# Patient Record
Sex: Male | Born: 1960 | Race: Black or African American | Hispanic: No | Marital: Single | Smoking: Former smoker
Health system: Southern US, Community
[De-identification: ages and names within clinical notes are randomized; demographics above are authoritative.]

## PROBLEM LIST (undated history)

## (undated) DIAGNOSIS — R7303 Prediabetes: Secondary | ICD-10-CM

## (undated) DIAGNOSIS — E119 Type 2 diabetes mellitus without complications: Secondary | ICD-10-CM

## (undated) DIAGNOSIS — M1990 Unspecified osteoarthritis, unspecified site: Secondary | ICD-10-CM

## (undated) DIAGNOSIS — C801 Malignant (primary) neoplasm, unspecified: Secondary | ICD-10-CM

## (undated) DIAGNOSIS — E785 Hyperlipidemia, unspecified: Secondary | ICD-10-CM

## (undated) DIAGNOSIS — F32A Depression, unspecified: Secondary | ICD-10-CM

## (undated) DIAGNOSIS — I10 Essential (primary) hypertension: Secondary | ICD-10-CM

## (undated) DIAGNOSIS — J302 Other seasonal allergic rhinitis: Secondary | ICD-10-CM

## (undated) HISTORY — PX: PROSTATE BIOPSY: SHX241

---

## 2019-07-28 ENCOUNTER — Encounter (HOSPITAL_COMMUNITY): Payer: Self-pay

## 2019-07-28 ENCOUNTER — Other Ambulatory Visit: Payer: Self-pay

## 2019-07-28 ENCOUNTER — Ambulatory Visit (HOSPITAL_COMMUNITY)
Admission: EM | Admit: 2019-07-28 | Discharge: 2019-07-28 | Disposition: A | Discharge status: Home / Self Care | Payer: BLUE CROSS/BLUE SHIELD | Attending: Internal Medicine | Admitting: Internal Medicine

## 2019-07-28 DIAGNOSIS — M545 Low back pain, unspecified: Secondary | ICD-10-CM

## 2019-07-28 HISTORY — DX: Essential (primary) hypertension: I10

## 2019-07-28 HISTORY — DX: Other seasonal allergic rhinitis: J30.2

## 2019-07-28 HISTORY — DX: Hyperlipidemia, unspecified: E78.5

## 2019-07-28 LAB — COMPREHENSIVE METABOLIC PANEL
ALT: 44 U/L (ref 0–44)
AST: 35 U/L (ref 15–41)
Albumin: 4.3 g/dL (ref 3.5–5.0)
Alkaline Phosphatase: 83 U/L (ref 38–126)
Anion gap: 8 (ref 5–15)
BUN: 14 mg/dL (ref 6–20)
CO2: 26 mmol/L (ref 22–32)
Calcium: 9.3 mg/dL (ref 8.9–10.3)
Chloride: 106 mmol/L (ref 98–111)
Creatinine, Ser: 1.09 mg/dL (ref 0.61–1.24)
GFR calc Af Amer: >60 mL/min (ref >60)
GFR calc non Af Amer: >60 mL/min (ref >60)
Glucose, Bld: 116 mg/dL — ABNORMAL HIGH (ref 70–99)
Potassium: 4.2 mmol/L (ref 3.5–5.1)
Sodium: 140 mmol/L (ref 135–145)
Total Bilirubin: 0.5 mg/dL (ref 0.3–1.2)
Total Protein: 7.2 g/dL (ref 6.5–8.1)

## 2019-07-28 LAB — CBC WITH DIFFERENTIAL/PLATELET
Abs Immature Granulocytes: 0.02 10*3/uL (ref 0.00–0.07)
Basophils Absolute: 0.1 10*3/uL (ref 0.0–0.1)
Basophils Relative: 1 %
Eosinophils Absolute: 0.2 10*3/uL (ref 0.0–0.5)
Eosinophils Relative: 3 %
HCT: 48 % (ref 39.0–52.0)
Hemoglobin: 16.5 g/dL (ref 13.0–17.0)
Immature Granulocytes: 0 %
Lymphocytes Relative: 37 %
Lymphs Abs: 2.6 10*3/uL (ref 0.7–4.0)
MCH: 24.7 pg — ABNORMAL LOW (ref 26.0–34.0)
MCHC: 34.4 g/dL (ref 30.0–36.0)
MCV: 71.7 fL — ABNORMAL LOW (ref 80.0–100.0)
Monocytes Absolute: 0.7 10*3/uL (ref 0.1–1.0)
Monocytes Relative: 10 %
Neutro Abs: 3.4 10*3/uL (ref 1.7–7.7)
Neutrophils Relative %: 49 %
Platelets: 238 10*3/uL (ref 150–400)
RBC: 6.69 MIL/uL — ABNORMAL HIGH (ref 4.22–5.81)
RDW: 16.6 % — ABNORMAL HIGH (ref 11.5–15.5)
WBC: 7 10*3/uL (ref 4.0–10.5)
nRBC: 0 % (ref 0.0–0.2)

## 2019-07-28 MED ORDER — KETOROLAC TROMETHAMINE 30 MG/ML IJ SOLN
INTRAMUSCULAR | Status: AC
  Filled 2019-07-28: qty 1

## 2019-07-28 MED ORDER — CYCLOBENZAPRINE HCL 5 MG PO TABS
5.0000 mg | ORAL_TABLET | Freq: Three times a day (TID) | ORAL | 0 refills | Status: DC | PRN
Start: 2019-07-28 — End: 2020-02-21

## 2019-07-28 MED ORDER — LORATADINE 10 MG PO TABS
10.0000 mg | ORAL_TABLET | Freq: Every day | ORAL | 2 refills | Status: DC
Start: 2019-07-28 — End: 2020-04-26

## 2019-07-28 MED ORDER — LOSARTAN POTASSIUM-HCTZ 50-12.5 MG PO TABS: 1.0000 | ORAL_TABLET | Freq: Every day | ORAL | 1 refills | Status: DC

## 2019-07-28 MED ORDER — IBUPROFEN 600 MG PO TABS
600.0000 mg | ORAL_TABLET | Freq: Four times a day (QID) | ORAL | 0 refills | Status: DC | PRN
Start: 2019-07-28 — End: 2020-02-21

## 2019-07-28 MED ORDER — KETOROLAC TROMETHAMINE 30 MG/ML IJ SOLN
30.0000 mg | Freq: Once | INTRAMUSCULAR | Status: AC
  Administered 2019-07-28: 10:00:00 30 mg via INTRAMUSCULAR

## 2019-07-28 MED ORDER — AMLODIPINE BESYLATE 10 MG PO TABS: 10.0000 mg | ORAL_TABLET | Freq: Every day | ORAL | 1 refills | Status: DC

## 2019-07-28 MED ORDER — FLUTICASONE PROPIONATE 50 MCG/ACT NA SUSP: 1.0000 | Freq: Every day | NASAL | 1 refills | Status: DC

## 2019-07-28 NOTE — ED Triage Notes (Signed)
Pt c/o 8/10 throbbing pain in lower backx6 days. Pt states 6 days ago he was loading heavy boxes and that's when he got the back pain. Pt denies numbness and tingling. Pt denies loss of bowel or bladder. Pt was able to walk to exam room. Pt states when he sits straight up or gets up from sitting it aggravates it.

## 2019-07-29 NOTE — ED Provider Notes (Signed)
[place]    CSN: [id number] Arrival date & time: 07/28/19  0827      History   Chief Complaint Chief Complaint  Patient presents with  . Back Pain    HPI Julian [name] is a 59 y.o. male comes to the urgent care with throbbing lower back pain of 6 days duration.  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  He started having lower back pain afterwards.  Patient denies any numbness or tingling in the lower extremities.  He is able to walk to the exam room with no problems.  No nausea or vomiting.  No falls.  No bladder or bowel problems.  Pain is worsened by trying to sit up from laying position.  No relieving factors.  No weakness in the lower extremities.   Patient has run out of antihypertensive medications and is requesting medication refill.  Blood pressure is currently uncontrolled.  HPI  Past Medical History:  Diagnosis Date  . Hyperlipidemia   . Hypertension   . Seasonal allergies     There are no problems to display for this patient.   History reviewed. No pertinent surgical history.     Home Medications    Prior to Admission medications   Medication Sig Start Date End Date Taking? Authorizing Provider  amLODipine (NORVASC) 10 MG tablet Take 1 tablet (10 mg total) by mouth daily. 07/28/19   Raeghan Demeter, Myrene Galas, MD  cyclobenzaprine (FLEXERIL) 5 MG tablet Take 1 tablet (5 mg total) by mouth 3 (three) times daily as needed for muscle spasms. 07/28/19   Chase Picket, MD  fluticasone (FLONASE) 50 MCG/ACT nasal spray Place 1 spray into both nostrils daily. 07/28/19   Gamble Enderle, Myrene Galas, MD  ibuprofen (ADVIL) 600 MG tablet Take 1 tablet (600 mg total) by mouth every 6 (six) hours as needed. 07/28/19   Chase Picket, MD  loratadine (CLARITIN) 10 MG tablet Take 1 tablet (10 mg total) by mouth daily. 07/28/19   LampteyMyrene Galas, MD  losartan-hydrochlorothiazide (HYZAAR) 50-12.5 MG tablet Take 1 tablet by mouth daily. 07/28/19   LampteyMyrene Galas, MD     Family History Family History  Problem Relation Age of Onset  . Healthy Mother   . Healthy Father     Social History Social History   Tobacco Use  . Smoking status: Former Research scientist (life sciences)  . Smokeless tobacco: Never Used  Substance Use Topics  . Alcohol use: Never  . Drug use: Never     Allergies   Patient has no known allergies.   Review of Systems Review of Systems  Constitutional: Positive for activity change. Negative for chills, fatigue and fever.  HENT: Negative.   Respiratory: Negative for cough, chest tightness and shortness of breath.   Gastrointestinal: Negative.  Negative for abdominal pain, nausea and vomiting.  Genitourinary: Negative.   Musculoskeletal: Positive for arthralgias and back pain. Negative for joint swelling, neck pain and neck stiffness.  Neurological: Negative for dizziness, light-headedness and headaches.     Physical Exam Triage Vital Signs ED Triage Vitals [07/28/19 0903]  Enc Vitals Group     BP (!) 162/105     Pulse Rate 84     Resp 18     Temp 97.7 F (36.5 C)     Temp Source Oral     SpO2 98 %     Weight 240 lb (108.9 kg)     Height 5\' 10"  (0.865 m)     Head  Circumference      Peak Flow      Pain Score 8     Pain Loc      Pain Edu?      Excl. in [place]?    No data found.  Updated Vital Signs BP (!) 162/105   Pulse 84   Temp 97.7 F (36.5 C) (Oral)   Resp 18   Ht 5\' 10"  (1.778 m)   Wt 108.9 kg   SpO2 98%   BMI 34.44 kg/m   Visual Acuity Right Eye Distance:   Left Eye Distance:   Bilateral Distance:    Right Eye Near:   Left Eye Near:    Bilateral Near:     Physical Exam Constitutional:      General: He is in acute distress.     Appearance: He is not ill-appearing.  Cardiovascular:     Rate and Rhythm: Normal rate and regular rhythm.     Heart sounds: No murmur. No friction rub.  Pulmonary:     Effort: Pulmonary effort is normal.     Breath sounds: Normal breath sounds. No wheezing or rhonchi.   Abdominal:     General: Bowel sounds are normal. There is no distension.     Palpations: Abdomen is soft.  Musculoskeletal:        General: Tenderness present. No deformity. Normal range of motion.     Right lower leg: No edema.     Left lower leg: No edema.  Skin:    Capillary Refill: Capillary refill takes less than 2 seconds.  Neurological:     General: No focal deficit present.     Mental Status: He is alert.      UC Treatments / Results  Labs (all labs ordered are listed, but only abnormal results are displayed) Labs Reviewed  CBC WITH DIFFERENTIAL/PLATELET - Abnormal; Notable for the following components:      Result Value   RBC 6.69 (*)    MCV 71.7 (*)    MCH 24.7 (*)    RDW 16.6 (*)    All other components within normal limits  COMPREHENSIVE METABOLIC PANEL - Abnormal; Notable for the following components:   Glucose, Bld 116 (*)    All other components within normal limits    EKG   Radiology No results found.  Procedures Procedures (including critical care time)  Medications Ordered in UC Medications  ketorolac (TORADOL) 30 MG/ML injection 30 mg (30 mg Intramuscular Given 07/28/19 1008)    Initial Impression / Assessment and Plan / UC Course  I have reviewed the triage vital signs and the nursing notes.  Pertinent labs & imaging results that were available during my care of the patient were reviewed by me and considered in my medical decision making (see chart for details).     1.  Acute low back pain without sciatica: Flexeril 1 tablet 3 times daily as needed for back spasms Ibuprofen 600 mg as needed for pain Gentle range of motion exercises Return precautions given  2.  Uncontrolled hypertension: Refill amlodipine, losartan-hydrochlorothiazide. Patient is advised to be compliant with medications. Patient needs primary care physician to manage hypertension.  Final Clinical Impressions(s) / UC Diagnoses   Final diagnoses:  Acute low back  pain without sciatica, unspecified back pain laterality   Discharge Instructions   None    ED Prescriptions    Medication Sig Dispense Auth. Provider   amLODipine (NORVASC) 10 MG tablet Take 1 tablet (10 mg total) by mouth  daily. 90 tablet Anitria Andon, Britta Mccreedy, MD   fluticasone (FLONASE) 50 MCG/ACT nasal spray Place 1 spray into both nostrils daily. 16 g Lamoine Magallon, Britta Mccreedy, MD   losartan-hydrochlorothiazide (HYZAAR) 50-12.5 MG tablet Take 1 tablet by mouth daily. 90 tablet Danie Diehl, Britta Mccreedy, MD   loratadine (CLARITIN) 10 MG tablet Take 1 tablet (10 mg total) by mouth daily. 90 tablet Zaida Reiland, Britta Mccreedy, MD   ibuprofen (ADVIL) 600 MG tablet Take 1 tablet (600 mg total) by mouth every 6 (six) hours as needed. 30 tablet Laikyn Gewirtz, Britta Mccreedy, MD   cyclobenzaprine (FLEXERIL) 5 MG tablet Take 1 tablet (5 mg total) by mouth 3 (three) times daily as needed for muscle spasms. 30 tablet Luke Falero, Britta Mccreedy, MD     PDMP not reviewed this encounter.   Merrilee Jansky, MD 07/29/19 [id number]

## 2019-08-04 ENCOUNTER — Ambulatory Visit (HOSPITAL_COMMUNITY)
Admission: EM | Admit: 2019-08-04 | Discharge: 2019-08-04 | Disposition: A | Discharge status: Home / Self Care | Payer: BLUE CROSS/BLUE SHIELD | Attending: Family Medicine | Admitting: Family Medicine

## 2019-08-04 ENCOUNTER — Other Ambulatory Visit: Payer: Self-pay

## 2019-08-04 ENCOUNTER — Ambulatory Visit (INDEPENDENT_AMBULATORY_CARE_PROVIDER_SITE_OTHER): Payer: BLUE CROSS/BLUE SHIELD

## 2019-08-04 ENCOUNTER — Encounter (HOSPITAL_COMMUNITY): Payer: Self-pay

## 2019-08-04 DIAGNOSIS — M47816 Spondylosis without myelopathy or radiculopathy, lumbar region: Secondary | ICD-10-CM

## 2019-08-04 MED ORDER — PREDNISONE 10 MG (21) PO TBPK
ORAL_TABLET | ORAL | 0 refills | Status: DC
Start: 2019-08-04 — End: 2020-02-21

## 2019-08-04 MED ORDER — TRAMADOL HCL 50 MG PO TABS: 50.0000 mg | ORAL_TABLET | Freq: Two times a day (BID) | ORAL | 0 refills | Status: AC | PRN

## 2019-08-04 NOTE — ED Provider Notes (Signed)
[facility]    CSN: [id number] Arrival date & time: 08/04/19  7026      History   Chief Complaint Chief Complaint  Patient presents with  . Back Pain    HPI Julian [name] is a 59 y.o. male.   Patient is a 59 year old male that presents today with persistent lower back pain.  Symptoms have been waxing and waning.  Describes as throbbing, aching and sharp at times.  He has been taking ibuprofen and Flexeril without much relief.  Feels like the pain improves and then worsens when he goes to work.  He does a lot of strenuous, heavy lifting at work.  Also worse with movement and going from sitting to standing.  Sometimes the pain radiates into the left hip and upper leg area.  Denies any associated numbness, tingling or weakness in extremities.  Denies any saddle paresthesias, loss of bowel or bladder function.  ROS per HPI      Past Medical History:  Diagnosis Date  . Hyperlipidemia   . Hypertension   . Seasonal allergies     There are no problems to display for this patient.   History reviewed. No pertinent surgical history.     Home Medications    Prior to Admission medications   Medication Sig Start Date End Date Taking? Authorizing Provider  amLODipine (NORVASC) 10 MG tablet Take 1 tablet (10 mg total) by mouth daily. 07/28/19   Lamptey, Britta Mccreedy, MD  cyclobenzaprine (FLEXERIL) 5 MG tablet Take 1 tablet (5 mg total) by mouth 3 (three) times daily as needed for muscle spasms. 07/28/19   Merrilee Jansky, MD  fluticasone (FLONASE) 50 MCG/ACT nasal spray Place 1 spray into both nostrils daily. 07/28/19   Lamptey, Britta Mccreedy, MD  ibuprofen (ADVIL) 600 MG tablet Take 1 tablet (600 mg total) by mouth every 6 (six) hours as needed. 07/28/19   Merrilee Jansky, MD  loratadine (CLARITIN) 10 MG tablet Take 1 tablet (10 mg total) by mouth daily. 07/28/19   LampteyBritta Mccreedy, MD  losartan-hydrochlorothiazide (HYZAAR) 50-12.5 MG tablet Take 1 tablet by mouth daily. 07/28/19    Lamptey, Britta Mccreedy, MD  predniSONE (STERAPRED UNI-PAK 21 TAB) 10 MG (21) TBPK tablet 6 tabs for 1 day, then 5 tabs for 1 das, then 4 tabs for 1 day, then 3 tabs for 1 day, 2 tabs for 1 day, then 1 tab for 1 day 08/04/19   Dahlia Byes A, NP  traMADol (ULTRAM) 50 MG tablet Take 1 tablet (50 mg total) by mouth every 12 (twelve) hours as needed for up to 6 days. 08/04/19 08/10/19  Janace Aris, NP    Family History Family History  Problem Relation Age of Onset  . Healthy Mother   . Healthy Father     Social History Social History   Tobacco Use  . Smoking status: Former Games developer  . Smokeless tobacco: Never Used  Substance Use Topics  . Alcohol use: Never  . Drug use: Never     Allergies   Patient has no known allergies.   Review of Systems Review of Systems   Physical Exam Triage Vital Signs ED Triage Vitals  Enc Vitals Group     BP 08/04/19 0902 (!) 154/101     Pulse Rate 08/04/19 0902 86     Resp 08/04/19 0902 18     Temp 08/04/19 0902 98.4 F (36.9 C)     Temp Source 08/04/19 0902 Oral  SpO2 08/04/19 0902 99 %     Weight 08/04/19 0907 240 lb (108.9 kg)     Height 08/04/19 0907 5\' 10"  (1.778 m)     Head Circumference --      Peak Flow --      Pain Score 08/04/19 0907 8     Pain Loc --      Pain Edu? --      Excl. in [place]? --    No data found.  Updated Vital Signs BP (!) 154/101   Pulse 86   Temp 98.4 F (36.9 C) (Oral)   Resp 18   Ht 5\' 10"  (1.778 m)   Wt 240 lb (108.9 kg)   SpO2 99%   BMI 34.44 kg/m   Visual Acuity Right Eye Distance:   Left Eye Distance:   Bilateral Distance:    Right Eye Near:   Left Eye Near:    Bilateral Near:     Physical Exam Vitals and nursing note reviewed.  Constitutional:      Appearance: Normal appearance.  HENT:     Head: Normocephalic and atraumatic.     Nose: Nose normal.  Eyes:     Conjunctiva/sclera: Conjunctivae normal.  Pulmonary:     Effort: Pulmonary effort is normal.  Musculoskeletal:     Cervical  back: Normal range of motion.     Lumbar back: Tenderness and bony tenderness present. Decreased range of motion. Positive left straight leg raise test.       Back:  Skin:    General: Skin is warm and dry.  Neurological:     Mental Status: He is alert.  Psychiatric:        Mood and Affect: Mood normal.      UC Treatments / Results  Labs (all labs ordered are listed, but only abnormal results are displayed) Labs Reviewed - No data to display  EKG   Radiology DG Lumbar Spine Complete  Result Date: 08/04/2019 CLINICAL DATA:  Low back pain EXAM: LUMBAR SPINE - COMPLETE 4+ VIEW COMPARISON:  None. FINDINGS: Frontal, lateral, spot lumbosacral lateral, and bilateral oblique views were obtained. There are 5 non-rib-bearing lumbar type vertebral bodies. No fracture or spondylolisthesis. Disc spaces appear unremarkable. There are multiple anterior and lateral osteophytes. There is facet osteoarthritic change at L4-5 and L5-S1 bilaterally. Other facets appear unremarkable. IMPRESSION: Facet osteoarthritic change at L4-5 and L5-S1. Osteophytes at multiple levels noted. No appreciable disc space narrowing. No fracture or spondylolisthesis. Electronically Signed   By: Lowella Grip III M.D.   On: 08/04/2019 10:36    Procedures Procedures (including critical care time)  Medications Ordered in UC Medications - No data to display  Initial Impression / Assessment and Plan / UC Course  I have reviewed the triage vital signs and the nursing notes.  Pertinent labs & imaging results that were available during my care of the patient were reviewed by me and considered in my medical decision making (see chart for details).    Osteoarthritis of the spine Osteoarthritic changes of the lumbar spine.  Located L4-L5 and L5-S1.  Osteophytes at multiple levels There is also some sciatic nerve involvement.  Positive straight leg on the left. Treating with prednisone taper over the next 6 days.  Tramadol  as needed for severe pain He can continue the Flexeril as needed for muscle relaxants. Contact given for primary care for follow-up in case symptoms do not improve Follow up as needed for continued or worsening symptoms  Final Clinical  Impressions(s) / UC Diagnoses   Final diagnoses:  Osteoarthritis of lumbar spine, unspecified spinal osteoarthritis complication status     Discharge Instructions     I believe that your symptoms are related to some osteoarthritis of the spine and sciatic nerve pain. We are treating this with prednisone taper over the next 6 days.  Take this with food. Tramadol as needed for severe pain.  Be aware this may make you drowsy. Rest, work not given  Contact given for primary care follow-up for any continued or worsening problems.     ED Prescriptions    Medication Sig Dispense Auth. Provider   predniSONE (STERAPRED UNI-PAK 21 TAB) 10 MG (21) TBPK tablet 6 tabs for 1 day, then 5 tabs for 1 das, then 4 tabs for 1 day, then 3 tabs for 1 day, 2 tabs for 1 day, then 1 tab for 1 day 21 tablet Adriano Bischof A, NP   traMADol (ULTRAM) 50 MG tablet Take 1 tablet (50 mg total) by mouth every 12 (twelve) hours as needed for up to 6 days. 12 tablet Tehani Mersman A, NP     I have reviewed the PDMP during this encounter.   Janace Aris, NP 08/04/19 1315

## 2019-08-04 NOTE — Discharge Instructions (Addendum)
I believe that your symptoms are related to some osteoarthritis of the spine and sciatic nerve pain. We are treating this with prednisone taper over the next 6 days.  Take this with food. Tramadol as needed for severe pain.  Be aware this may make you drowsy. Rest, work not given  Contact given for primary care follow-up for any continued or worsening problems.

## 2019-08-04 NOTE — ED Triage Notes (Signed)
Pt c/o 8/10 sharp throbbing pain in lower back. Pt states the back pain is worse with movement and going from sitting to standing. Pt states the meds help when they're in his system, but soon as it leaves his system he's in pain again. Pt states he was bent over pushing boxes from one side to another at work and it med his back hurt worse. Pt denies numbness and tingling. Pt was able to walk to exam room.

## 2020-02-06 ENCOUNTER — Other Ambulatory Visit (HOSPITAL_COMMUNITY): Payer: Self-pay | Admitting: Physician Assistant

## 2020-02-06 ENCOUNTER — Other Ambulatory Visit: Payer: Self-pay | Admitting: Physician Assistant

## 2020-02-07 ENCOUNTER — Other Ambulatory Visit (HOSPITAL_COMMUNITY): Payer: Self-pay | Admitting: Physician Assistant

## 2020-02-07 DIAGNOSIS — M5416 Radiculopathy, lumbar region: Secondary | ICD-10-CM

## 2020-02-24 NOTE — Progress Notes (Signed)
PCP:  Denies Cardiologist:  Denies   EKG:  DOS CXR:  N/A ECHO:  Denies Stress Test:  Denies Cardiac Cath:  Denies  Fasting Blood Sugar-  N/A Checks Blood Sugar__N/A_ times a day  OSA/CPAP:  No  ASA/Blood Thinner:  No  Covid test 02/25/20  Anesthesia Review:  No  Patient denies shortness of breath, fever, cough, and chest pain at PAT appointment.  Patient verbalized understanding of instructions provided today at the PAT appointment.  Patient asked to review instructions at home and day of surgery.

## 2020-02-25 ENCOUNTER — Other Ambulatory Visit (HOSPITAL_COMMUNITY)
Admission: RE | Admit: 2020-02-25 | Discharge: 2020-02-25 | Disposition: A | Discharge status: Home / Self Care | Payer: BLUE CROSS/BLUE SHIELD | Source: Ambulatory Visit | Attending: Physician Assistant | Admitting: Physician Assistant

## 2020-02-25 DIAGNOSIS — Z20822 Contact with and (suspected) exposure to covid-19: Secondary | ICD-10-CM | POA: Insufficient documentation

## 2020-02-25 DIAGNOSIS — Z01812 Encounter for preprocedural laboratory examination: Secondary | ICD-10-CM | POA: Diagnosis not present

## 2020-02-26 LAB — SARS CORONAVIRUS 2 (TAT 6-24 HRS): SARS Coronavirus 2: NEGATIVE

## 2020-02-28 ENCOUNTER — Other Ambulatory Visit: Payer: Self-pay

## 2020-02-28 ENCOUNTER — Encounter (HOSPITAL_COMMUNITY): Admission: RE | Disposition: A | Discharge status: Home / Self Care | Payer: Self-pay | Source: Ambulatory Visit

## 2020-02-28 ENCOUNTER — Ambulatory Visit (HOSPITAL_COMMUNITY)
Admission: RE | Admit: 2020-02-28 | Discharge: 2020-02-28 | Disposition: A | Discharge status: Home / Self Care | Payer: BLUE CROSS/BLUE SHIELD | Source: Ambulatory Visit | Attending: Physician Assistant | Admitting: Physician Assistant

## 2020-02-28 ENCOUNTER — Encounter (HOSPITAL_COMMUNITY): Payer: Self-pay

## 2020-02-28 ENCOUNTER — Ambulatory Visit (HOSPITAL_COMMUNITY): Payer: BLUE CROSS/BLUE SHIELD | Admitting: Certified Registered Nurse Anesthetist

## 2020-02-28 DIAGNOSIS — M4726 Other spondylosis with radiculopathy, lumbar region: Secondary | ICD-10-CM | POA: Insufficient documentation

## 2020-02-28 DIAGNOSIS — M48061 Spinal stenosis, lumbar region without neurogenic claudication: Secondary | ICD-10-CM | POA: Insufficient documentation

## 2020-02-28 DIAGNOSIS — Z87891 Personal history of nicotine dependence: Secondary | ICD-10-CM | POA: Insufficient documentation

## 2020-02-28 DIAGNOSIS — M5416 Radiculopathy, lumbar region: Secondary | ICD-10-CM | POA: Insufficient documentation

## 2020-02-28 HISTORY — PX: RADIOLOGY WITH ANESTHESIA: SHX6223

## 2020-02-28 LAB — BASIC METABOLIC PANEL
Anion gap: 13 (ref 5–15)
BUN: 16 mg/dL (ref 6–20)
CO2: 23 mmol/L (ref 22–32)
Calcium: 9.3 mg/dL (ref 8.9–10.3)
Chloride: 107 mmol/L (ref 98–111)
Creatinine, Ser: 1.33 mg/dL — ABNORMAL HIGH (ref 0.61–1.24)
GFR, Estimated: >60 mL/min (ref >60)
Glucose, Bld: 150 mg/dL — ABNORMAL HIGH (ref 70–99)
Potassium: 4.4 mmol/L (ref 3.5–5.1)
Sodium: 143 mmol/L (ref 135–145)

## 2020-02-28 LAB — CBC
HCT: 48.5 % (ref 39.0–52.0)
Hemoglobin: 16.4 g/dL (ref 13.0–17.0)
MCH: 24.4 pg — ABNORMAL LOW (ref 26.0–34.0)
MCHC: 33.8 g/dL (ref 30.0–36.0)
MCV: 72.2 fL — ABNORMAL LOW (ref 80.0–100.0)
Platelets: 224 10*3/uL (ref 150–400)
RBC: 6.72 MIL/uL — ABNORMAL HIGH (ref 4.22–5.81)
RDW: 17.2 % — ABNORMAL HIGH (ref 11.5–15.5)
WBC: 10.3 10*3/uL (ref 4.0–10.5)
nRBC: 0 % (ref 0.0–0.2)

## 2020-02-28 SURGERY — MRI WITH ANESTHESIA
Anesthesia: General

## 2020-02-28 MED ORDER — PROPOFOL 10 MG/ML IV BOLUS
INTRAVENOUS | Status: DC | PRN
  Administered 2020-02-28: 200 mg via INTRAVENOUS
  Administered 2020-02-28: 50 mg via INTRAVENOUS
  Administered 2020-02-28: 100 mg via INTRAVENOUS

## 2020-02-28 MED ORDER — ONDANSETRON HCL 4 MG/2ML IJ SOLN
INTRAMUSCULAR | Status: DC | PRN
  Administered 2020-02-28: 4 mg via INTRAVENOUS

## 2020-02-28 MED ORDER — MIDAZOLAM HCL 2 MG/2ML IJ SOLN
INTRAMUSCULAR | Status: DC | PRN
  Administered 2020-02-28: 2 mg via INTRAVENOUS

## 2020-02-28 MED ORDER — LACTATED RINGERS IV SOLN: INTRAVENOUS | Status: DC

## 2020-02-28 MED ORDER — LIDOCAINE 2% (20 MG/ML) 5 ML SYRINGE
INTRAMUSCULAR | Status: DC | PRN
  Administered 2020-02-28: 100 mg via INTRAVENOUS

## 2020-02-28 NOTE — Anesthesia Procedure Notes (Addendum)
Procedure Name: LMA Insertion Date/Time: 02/28/2020 8:38 AM Performed by: Drema Pry, CRNA Pre-anesthesia Checklist: Patient identified, Emergency Drugs available, Suction available and Patient being monitored Patient Re-evaluated:Patient Re-evaluated prior to induction Oxygen Delivery Method: Circle system utilized Preoxygenation: Pre-oxygenation with 100% oxygen Induction Type: IV induction LMA: LMA inserted LMA Size: 5.0 Number of attempts: 1 Placement Confirmation: positive ETCO2 and breath sounds checked- equal and bilateral Tube secured with: Tape Dental Injury: Teeth and Oropharynx as per pre-operative assessment

## 2020-02-28 NOTE — Anesthesia Preprocedure Evaluation (Addendum)
Anesthesia Evaluation  Patient identified by MRN, date of birth, ID band Patient awake    Reviewed: Allergy & Precautions, NPO status , Patient's Chart, lab work & pertinent test results  Airway Mallampati: II  TM Distance: >3 FB Neck ROM: Full    Dental  (+) Missing, Dental Advisory Given, Loose,    Pulmonary neg pulmonary ROS, former smoker,    Pulmonary exam normal breath sounds clear to auscultation       Cardiovascular hypertension, Pt. on medications Normal cardiovascular exam Rhythm:Regular Rate:Normal  HLD   Neuro/Psych negative neurological ROS  negative psych ROS   GI/Hepatic negative GI ROS, Neg liver ROS,   Endo/Other  negative endocrine ROS  Renal/GU negative Renal ROS  negative genitourinary   Musculoskeletal negative musculoskeletal ROS (+)   Abdominal   Peds  Hematology negative hematology ROS (+)   Anesthesia Other Findings   Reproductive/Obstetrics                            Anesthesia Physical Anesthesia Plan  ASA: II  Anesthesia Plan: General   Post-op Pain Management:    Induction: Intravenous  PONV Risk Score and Plan: 2 and Midazolam, Dexamethasone and Ondansetron  Airway Management Planned: LMA  Additional Equipment:   Intra-op Plan:   Post-operative Plan: Extubation in OR  Informed Consent: I have reviewed the patients History and Physical, chart, labs and discussed the procedure including the risks, benefits and alternatives for the proposed anesthesia with the patient or authorized representative who has indicated his/her understanding and acceptance.     Dental advisory given  Plan Discussed with: CRNA  Anesthesia Plan Comments:         Anesthesia Quick Evaluation

## 2020-02-28 NOTE — Transfer of Care (Signed)
Immediate Anesthesia Transfer of Care Note  Patient: Julian [name]  Procedure(s) Performed: MRI WITH ANESTHESIA LUMBAR SPINE WITHOUT CONTRAST (N/A )  Patient Location: PACU  Anesthesia Type:General  Level of Consciousness: drowsy and patient cooperative  Airway & Oxygen Therapy: Patient Spontanous Breathing  Post-op Assessment: Report given to RN and Post -op Vital signs reviewed and stable  Post vital signs: Reviewed and stable  Last Vitals:  Vitals Value Taken Time  BP 144/99 02/28/20 1023  Temp 36.4 C 02/28/20 1023  Pulse 96 02/28/20 1025  Resp 19 02/28/20 1025  SpO2 95 % 02/28/20 1025  Vitals shown include unvalidated device data.  Last Pain:  Vitals:   02/28/20 0843  TempSrc:   PainSc: 0-No pain         Complications: No complications documented.

## 2020-02-28 NOTE — Progress Notes (Signed)
Patient is in phase 2 and cleared for discharge. He is waiting on disc to be made from his MRI.

## 2020-02-29 ENCOUNTER — Encounter (HOSPITAL_COMMUNITY): Payer: Self-pay | Admitting: Radiology

## 2020-02-29 NOTE — Anesthesia Postprocedure Evaluation (Signed)
Anesthesia Post Note  Patient: Julian [name]  Procedure(s) Performed: MRI WITH ANESTHESIA LUMBAR SPINE WITHOUT CONTRAST (N/A )     Patient location during evaluation: PACU Anesthesia Type: General Level of consciousness: awake and alert Pain management: pain level controlled Vital Signs Assessment: post-procedure vital signs reviewed and stable Respiratory status: spontaneous breathing, nonlabored ventilation, respiratory function stable and patient connected to nasal cannula oxygen Cardiovascular status: blood pressure returned to baseline and stable Postop Assessment: no apparent nausea or vomiting Anesthetic complications: no   No complications documented.  Last Vitals:  Vitals:   02/28/20 1039 02/28/20 1057  BP: (!) 145/99   Pulse: 90   Resp: 20   Temp:  (!) 36.2 C  SpO2: 95%     Last Pain:  Vitals:   02/28/20 1039  TempSrc:   PainSc: 0-No pain                 Kyel Purk L Angenette Daily

## 2020-04-26 ENCOUNTER — Ambulatory Visit (HOSPITAL_COMMUNITY)
Admission: EM | Admit: 2020-04-26 | Discharge: 2020-04-26 | Disposition: A | Discharge status: Home / Self Care | Payer: HRSA Program | Attending: Student | Admitting: Student

## 2020-04-26 ENCOUNTER — Encounter (HOSPITAL_COMMUNITY): Payer: Self-pay

## 2020-04-26 ENCOUNTER — Other Ambulatory Visit: Payer: Self-pay

## 2020-04-26 ENCOUNTER — Ambulatory Visit (INDEPENDENT_AMBULATORY_CARE_PROVIDER_SITE_OTHER): Payer: Self-pay

## 2020-04-26 DIAGNOSIS — I10 Essential (primary) hypertension: Secondary | ICD-10-CM | POA: Diagnosis not present

## 2020-04-26 DIAGNOSIS — U071 COVID-19: Secondary | ICD-10-CM

## 2020-04-26 DIAGNOSIS — J302 Other seasonal allergic rhinitis: Secondary | ICD-10-CM

## 2020-04-26 DIAGNOSIS — R059 Cough, unspecified: Secondary | ICD-10-CM

## 2020-04-26 DIAGNOSIS — J1282 Pneumonia due to coronavirus disease 2019: Secondary | ICD-10-CM

## 2020-04-26 DIAGNOSIS — R0602 Shortness of breath: Secondary | ICD-10-CM

## 2020-04-26 MED ORDER — ALBUTEROL SULFATE HFA 108 (90 BASE) MCG/ACT IN AERS
INHALATION_SPRAY | RESPIRATORY_TRACT | Status: AC
  Filled 2020-04-26: qty 6.7

## 2020-04-26 MED ORDER — AZITHROMYCIN 250 MG PO TABS
250.0000 mg | ORAL_TABLET | Freq: Every day | ORAL | 0 refills | Status: DC
Start: 2020-04-26 — End: 2023-05-22

## 2020-04-26 MED ORDER — FLUTICASONE PROPIONATE 50 MCG/ACT NA SUSP
1.0000 | Freq: Every day | NASAL | 2 refills | Status: DC
Start: 2020-04-26 — End: 2023-05-22

## 2020-04-26 MED ORDER — AMLODIPINE BESYLATE 10 MG PO TABS: 10.0000 mg | ORAL_TABLET | Freq: Every day | ORAL | 2 refills | Status: AC

## 2020-04-26 MED ORDER — BENZONATATE 100 MG PO CAPS
100.0000 mg | ORAL_CAPSULE | Freq: Three times a day (TID) | ORAL | 0 refills | Status: DC
Start: 2020-04-26 — End: 2023-05-22

## 2020-04-26 MED ORDER — PULSE OXIMETER FOR FINGER MISC: 1.0000 | Freq: Two times a day (BID) | 0 refills | Status: DC

## 2020-04-26 MED ORDER — LOSARTAN POTASSIUM-HCTZ 50-12.5 MG PO TABS
1.0000 | ORAL_TABLET | Freq: Every day | ORAL | 2 refills | Status: DC
Start: 2020-04-26 — End: 2023-05-22

## 2020-04-26 MED ORDER — LORATADINE 10 MG PO TABS
10.0000 mg | ORAL_TABLET | Freq: Every day | ORAL | 2 refills | Status: AC
Start: 2020-04-26 — End: ?

## 2020-04-26 MED ORDER — PULSE OXIMETER MISC: 1.0000 | Freq: Two times a day (BID) | 0 refills | Status: AC

## 2020-04-26 MED ORDER — ALBUTEROL SULFATE HFA 108 (90 BASE) MCG/ACT IN AERS
1.0000 | INHALATION_SPRAY | Freq: Four times a day (QID) | RESPIRATORY_TRACT | Status: DC | PRN
  Administered 2020-04-26: 2 via RESPIRATORY_TRACT

## 2020-04-26 NOTE — ED Provider Notes (Signed)
[facility]    CSN: [id number] Arrival date & time: 04/26/20  1320      History   Chief Complaint Chief Complaint  Patient presents with  . Shortness of Breath  . Fatigue  . Cough  . loss of appetite    HPI Julian [name] is a 60 y.o. male presenting with shortness of breath fatigue cough and loss of appetite.  History of hypertension seasonal allergies and hyperlipidemia.  Patient states he tested positive for Covid 2 weeks ago.  Since then he has continued to cough and have shortness of breath particularly with exertion.  He endorses dry cough fatigue loss of appetite for 2 weeks.  Endorses few episodes of diarrhea yesterday.  Endorses subjective chills. Endorses nausea but no vomiting.  He is not vaccinated for Covid.  Denies chest pain, leg swelling  He also endorses a history of seasonal allergies that are typically well controlled on his claritin and Flonase.  He request refill of this he also requires refill of blood pressure medications.  States that his blood pressure is typically well controlled on these but he has been out of them due to not having insurance right now  HPI  Past Medical History:  Diagnosis Date  . Hyperlipidemia   . Hypertension   . Seasonal allergies     There are no problems to display for this patient.   Past Surgical History:  Procedure Laterality Date  . RADIOLOGY WITH ANESTHESIA N/A 02/28/2020   Procedure: MRI WITH ANESTHESIA LUMBAR SPINE WITHOUT CONTRAST;  Surgeon: Radiologist, Medication, MD;  Location: [facility];  Service: Radiology;  Laterality: N/A;       Home Medications    Prior to Admission medications   Medication Sig Start Date End Date Taking? Authorizing Provider  amLODipine (NORVASC) 10 MG tablet Take 1 tablet (10 mg total) by mouth daily. 04/26/20  Yes Rhys Martini, [name]  fluticasone (FLONASE) 50 MCG/ACT nasal spray Place 1 spray into both nostrils daily. 04/26/20  Yes Rhys Martini, [name]  loratadine (CLARITIN) 10  MG tablet Take 1 tablet (10 mg total) by mouth daily. 04/26/20  Yes Rhys Martini, [name]  losartan-hydrochlorothiazide (HYZAAR) 50-12.5 MG tablet Take 1 tablet by mouth daily. 04/26/20  Yes Rhys Martini, [name]  acetaminophen (TYLENOL) 500 MG tablet Take 500-1,000 mg by mouth every 6 (six) hours as needed (for pain.).    [provider]  cetirizine (ZYRTEC) 10 MG tablet Take 10 mg by mouth daily.    [provider]  gabapentin (NEURONTIN) 300 MG capsule Take 300 mg by mouth at bedtime.    [provider]  nabumetone (RELAFEN) 750 MG tablet Take 750 mg by mouth in the morning and at bedtime.    [provider]  tiZANidine (ZANAFLEX) 4 MG tablet Take 4 mg by mouth 3 (three) times daily as needed for muscle spasms.    [provider]    Family History Family History  Problem Relation Age of Onset  . Healthy Mother   . Healthy Father     Social History Social History   Tobacco Use  . Smoking status: Former Games developer  . Smokeless tobacco: Never Used  Vaping Use  . Vaping Use: Never used  Substance Use Topics  . Alcohol use: Never  . Drug use: Never     Allergies   Patient has no known allergies.   Review of Systems Review of Systems  Constitutional: Negative for appetite change, chills and fever.  HENT: Positive for congestion. Negative for ear pain, rhinorrhea, sinus pressure, sinus pain and sore throat.   Eyes: Negative for redness and visual disturbance.  Respiratory: Positive for cough and shortness of breath. Negative for chest tightness and wheezing.   Cardiovascular: Negative for chest pain and palpitations.  Gastrointestinal: Positive for diarrhea. Negative for abdominal pain, constipation, nausea and vomiting.  Genitourinary: Negative for dysuria, frequency and urgency.  Musculoskeletal: Negative for myalgias.  Neurological: Negative for dizziness, weakness and headaches.  Psychiatric/Behavioral: Negative for confusion.  All  other systems reviewed and are negative.    Physical Exam Triage Vital Signs ED Triage Vitals  Enc Vitals Group     BP 04/26/20 1409 (!) 156/98     Pulse Rate 04/26/20 1409 (!) 103     Resp 04/26/20 1409 20     Temp 04/26/20 1409 99.3 F (37.4 C)     Temp src --      SpO2 04/26/20 1409 95 %     Weight --      Height --      Head Circumference --      Peak Flow --      Pain Score 04/26/20 1407 5     Pain Loc --      Pain Edu? --      Excl. in GC? --    No data found.  Updated Vital Signs BP (!) 156/98   Pulse (!) 103   Temp 99.3 F (37.4 C)   Resp 20   SpO2 95%   Visual Acuity Right Eye Distance:   Left Eye Distance:   Bilateral Distance:    Right Eye Near:   Left Eye Near:    Bilateral Near:     Physical Exam Vitals reviewed.  Constitutional:      General: He is not in acute distress.    Appearance: Normal appearance. He is not ill-appearing.  HENT:     Head: Normocephalic and atraumatic.     Right Ear: Hearing, tympanic membrane, ear canal and external ear normal. No swelling or tenderness. There is no impacted cerumen. No mastoid tenderness. Tympanic membrane is not perforated, erythematous, retracted or bulging.     Left Ear: Hearing, tympanic membrane, ear canal and external ear normal. No swelling or tenderness. There is no impacted cerumen. No mastoid tenderness. Tympanic membrane is not perforated, erythematous, retracted or bulging.     Nose:     Right Sinus: No maxillary sinus tenderness or frontal sinus tenderness.     Left Sinus: No maxillary sinus tenderness or frontal sinus tenderness.     Mouth/Throat:     Mouth: Mucous membranes are moist.     Pharynx: Uvula midline. No oropharyngeal exudate or posterior oropharyngeal erythema.     Tonsils: No tonsillar exudate.  Cardiovascular:     Rate and Rhythm: Normal rate and regular rhythm.     Heart sounds: Normal heart sounds.  Pulmonary:     Breath sounds: Normal air entry. Examination of the  right-upper field reveals decreased breath sounds. Examination of the left-upper field reveals decreased breath sounds. Examination of the right-middle field reveals decreased breath sounds. Examination of the left-middle field reveals decreased breath sounds. Decreased breath sounds present. No wheezing, rhonchi or rales.  Chest:     Chest wall: No tenderness.  Abdominal:     General: Abdomen is flat. Bowel sounds are normal.     Tenderness: There is no abdominal tenderness. There is no guarding or rebound.  Lymphadenopathy:  Cervical: No cervical adenopathy.  Neurological:     General: No focal deficit present.     Mental Status: He is alert and oriented to person, place, and time.  Psychiatric:        Attention and Perception: Attention and perception normal.        Mood and Affect: Mood and affect normal.        Behavior: Behavior normal. Behavior is cooperative.        Thought Content: Thought content normal.        Judgment: Judgment normal.      UC Treatments / Results  Labs (all labs ordered are listed, but only abnormal results are displayed) Labs Reviewed - No data to display  EKG   Radiology No results found.  Procedures Procedures (including critical care time)  Medications Ordered in UC Medications - No data to display  Initial Impression / Assessment and Plan / UC Course  I have reviewed the triage vital signs and the nursing notes.  Pertinent labs & imaging results that were available during my care of the patient were reviewed by me and considered in my medical decision making (see chart for details).    This patient was diagnosed with Covid 2 weeks ago, presenting today with continued shortness of breath and cough. Not vaccinated for covid.   Chest x-ray today showing mild patchy bilateral lung opacities, left greater than right, concerning for multifocal pneumonia. Plan to treat for pneumonia with z-pack, albuterol inhaler, tessalon as below. Also sent  script for pulse ox machiene and discussed strict return precautions with this patient.   Blood pressure medications refilled as below. He states he has taken these for years without issue. He will f/u with his PCP as soon as he has  [employer] again.  claritin and flonase also refilled as below.   Spent over 40 minutes obtaining H&P, performing physical, interpreting films, discussing results, treatment plan and plan for follow-up with patient. Patient agrees with plan.     Final Clinical Impressions(s) / UC Diagnoses   Final diagnoses:  COVID-19  Essential hypertension  Seasonal allergies   Discharge Instructions   None    ED Prescriptions    Medication Sig Dispense Auth. Provider   loratadine (CLARITIN) 10 MG tablet Take 1 tablet (10 mg total) by mouth daily. 30 tablet Rhys Martini, [name]   fluticasone [facility]) 50 MCG/ACT nasal spray Place 1 spray into both nostrils daily. 15 mL Rhys Martini, [name]   losartan-hydrochlorothiazide (HYZAAR) 50-12.5 MG tablet Take 1 tablet by mouth daily. 30 tablet Rhys Martini, [name]   amLODipine (NORVASC) 10 MG tablet Take 1 tablet (10 mg total) by mouth daily. 30 tablet Rhys Martini, [name]     PDMP not reviewed this encounter.   Rhys Martini, [name] 04/26/20 1623

## 2020-04-26 NOTE — Discharge Instructions (Addendum)
-  Take the antibiotic- z-pack. Take two pills today. On days 2-4, one pill daily.  -Use the albuterol inhaler as needed for shortness of breath and cough.  -You can also try the Tessalon (Benzonatate), up to 3x daily for cough.  -Restart your blood pressure and allergy medications.  -Please pick up  pulse-ox monitor. Monitor your oxygen several times daily. If it drops below 94%, or you are feeling short of breath, seek additional medical attention.  -Come back and see [place] if you develop chest pain, worsening shortness of breath despite treatment, new/worsening fevers/chills, etc.

## 2020-04-26 NOTE — ED Triage Notes (Addendum)
Pt in with c/o SOB, dry cough, fatigue and loss of appetite that has been going on for 2 weeks now.  Pt has been taking tylenol for sxs with minimal relief  Pt tested positive for covid 2 weeks ago  Pt also requesting refill on his BP meds

## 2020-04-29 ENCOUNTER — Emergency Department (HOSPITAL_COMMUNITY): Payer: HRSA Program

## 2020-04-29 ENCOUNTER — Emergency Department (HOSPITAL_COMMUNITY)
Admission: EM | Admit: 2020-04-29 | Discharge: 2020-04-29 | Disposition: A | Discharge status: Home / Self Care | Payer: HRSA Program | Attending: Emergency Medicine | Admitting: Emergency Medicine

## 2020-04-29 ENCOUNTER — Encounter (HOSPITAL_COMMUNITY): Payer: Self-pay | Admitting: Emergency Medicine

## 2020-04-29 ENCOUNTER — Other Ambulatory Visit: Payer: Self-pay

## 2020-04-29 DIAGNOSIS — I10 Essential (primary) hypertension: Secondary | ICD-10-CM | POA: Insufficient documentation

## 2020-04-29 DIAGNOSIS — Z79899 Other long term (current) drug therapy: Secondary | ICD-10-CM | POA: Diagnosis not present

## 2020-04-29 DIAGNOSIS — Z87891 Personal history of nicotine dependence: Secondary | ICD-10-CM | POA: Insufficient documentation

## 2020-04-29 DIAGNOSIS — R0602 Shortness of breath: Secondary | ICD-10-CM | POA: Diagnosis present

## 2020-04-29 DIAGNOSIS — U071 COVID-19: Secondary | ICD-10-CM

## 2020-04-29 LAB — BASIC METABOLIC PANEL
Anion gap: 14 (ref 5–15)
BUN: 24 mg/dL — ABNORMAL HIGH (ref 6–20)
CO2: 20 mmol/L — ABNORMAL LOW (ref 22–32)
Calcium: 9 mg/dL (ref 8.9–10.3)
Chloride: 105 mmol/L (ref 98–111)
Creatinine, Ser: 1.6 mg/dL — ABNORMAL HIGH (ref 0.61–1.24)
GFR, Estimated: 50 mL/min — ABNORMAL LOW (ref >60)
Glucose, Bld: 129 mg/dL — ABNORMAL HIGH (ref 70–99)
Potassium: 3.9 mmol/L (ref 3.5–5.1)
Sodium: 139 mmol/L (ref 135–145)

## 2020-04-29 LAB — TROPONIN I (HIGH SENSITIVITY)
Troponin I (High Sensitivity): 21 ng/L — ABNORMAL HIGH (ref <18)
Troponin I (High Sensitivity): 22 ng/L — ABNORMAL HIGH (ref <18)

## 2020-04-29 LAB — CBC
HCT: 47.5 % (ref 39.0–52.0)
Hemoglobin: 15.9 g/dL (ref 13.0–17.0)
MCH: 23.8 pg — ABNORMAL LOW (ref 26.0–34.0)
MCHC: 33.5 g/dL (ref 30.0–36.0)
MCV: 71.1 fL — ABNORMAL LOW (ref 80.0–100.0)
Platelets: 380 10*3/uL (ref 150–400)
RBC: 6.68 MIL/uL — ABNORMAL HIGH (ref 4.22–5.81)
RDW: 17.2 % — ABNORMAL HIGH (ref 11.5–15.5)
WBC: 7.3 10*3/uL (ref 4.0–10.5)
nRBC: 0 % (ref 0.0–0.2)

## 2020-04-29 LAB — D-DIMER, QUANTITATIVE: D-Dimer, Quant: 0.56 ug/mL-FEU — ABNORMAL HIGH (ref 0.00–0.50)

## 2020-04-29 MED ORDER — SODIUM CHLORIDE 0.9 % IV BOLUS
1000.0000 mL | Freq: Once | INTRAVENOUS | Status: AC
  Administered 2020-04-29: 1000 mL via INTRAVENOUS

## 2020-04-29 NOTE — ED Provider Notes (Signed)
[facility] EMERGENCY DEPARTMENT Provider Note   CSN: [id number] Arrival date & time: 04/29/20  9147     History Chief Complaint  Patient presents with  . Covid Positive  . Shortness of Breath    Julian [name] is a 60 y.o. male.  Presents to ER with concern for shortness of breath.  Overall symptoms started approximately 2 weeks ago, initially mild symptoms with cough, malaise and body aches.  Over the past week he has had worsening shortness of breath.  No chest pain.  Has had poor appetite and some nausea.  Also having occasional loose stool.  No nausea currently. Has not been vaccinated for COVID-19.  Additional history from chart review Patient went to urgent care on 2/3.  At that time was Covid positive, CXR with pneumonia.  Given Z-Pak.  Patient reports he has taken this. HPI     Past Medical History:  Diagnosis Date  . Hyperlipidemia   . Hypertension   . Seasonal allergies     There are no problems to display for this patient.   Past Surgical History:  Procedure Laterality Date  . RADIOLOGY WITH ANESTHESIA N/A 02/28/2020   Procedure: MRI WITH ANESTHESIA LUMBAR SPINE WITHOUT CONTRAST;  Surgeon: Radiologist, Medication, MD;  Location: [facility];  Service: Radiology;  Laterality: N/A;       Family History  Problem Relation Age of Onset  . Healthy Mother   . Healthy Father     Social History   Tobacco Use  . Smoking status: Former Games developer  . Smokeless tobacco: Never Used  Vaping Use  . Vaping Use: Never used  Substance Use Topics  . Alcohol use: Never  . Drug use: Never    Home Medications Prior to Admission medications   Medication Sig Start Date End Date Taking? Authorizing Provider  acetaminophen (TYLENOL) 500 MG tablet Take 500-1,000 mg by mouth every 6 (six) hours as needed (for pain.).   Yes [provider]  amLODipine (NORVASC) 10 MG tablet Take 1 tablet (10 mg total) by mouth daily. 04/26/20  Yes Rhys Martini, [name]   azithromycin (ZITHROMAX Z-PAK) 250 MG tablet Take 1 tablet (250 mg total) by mouth daily. Z-pack: take two pills (500mg ) on day 1. Take 1 pill (250mg ) on days 2-4. 04/26/20  Yes , [name]  benzonatate (TESSALON) 100 MG capsule Take 1 capsule (100 mg total) by mouth every 8 (eight) hours. 04/26/20  Yes Rhys Martini, [name]  cetirizine (ZYRTEC) 10 MG tablet Take 10 mg by mouth daily.   Yes [provider]  fluticasone (FLONASE) 50 MCG/ACT nasal spray Place 1 spray into both nostrils daily. 04/26/20  Yes Rhys Martini, [name]  gabapentin (NEURONTIN) 300 MG capsule Take 300 mg by mouth at bedtime.   Yes [provider]  loratadine (CLARITIN) 10 MG tablet Take 1 tablet (10 mg total) by mouth daily. 04/26/20  Yes Rhys Martini, [name]  losartan-hydrochlorothiazide (HYZAAR) 50-12.5 MG tablet Take 1 tablet by mouth daily. 04/26/20  Yes Rhys Martini, [name]  nabumetone (RELAFEN) 750 MG tablet Take 750 mg by mouth in the morning and at bedtime.   Yes [provider]  Misc. Devices (PULSE OXIMETER FOR FINGER) MISC 1 each by Does not apply route in the morning and at bedtime. 04/26/20   Rhys Martini, [name]  Misc. Devices (PULSE OXIMETER) MISC 1 each by Does not apply route in the morning and at bedtime. 04/26/20   Rhys Martini,  [name]  tiZANidine (ZANAFLEX) 4 MG tablet Take 4 mg by mouth 3 (three) times daily as needed for muscle spasms.    [provider]    Allergies    Patient has no known allergies.  Review of Systems   Review of Systems  Constitutional: Positive for chills and fatigue. Negative for fever.  HENT: Negative for ear pain and sore throat.   Eyes: Negative for pain and visual disturbance.  Respiratory: Positive for cough and shortness of breath.   Cardiovascular: Negative for chest pain and palpitations.  Gastrointestinal: Negative for abdominal pain and vomiting.  Genitourinary: Negative for dysuria and hematuria.  Musculoskeletal: Negative for  arthralgias and back pain.  Skin: Negative for color change and rash.  Neurological: Negative for seizures and syncope.  All other systems reviewed and are negative.   Physical Exam Updated Vital Signs BP 111/80   Pulse (!) 102   Temp 98.5 F (36.9 C) (Oral)   Resp (!) 26   SpO2 96%   Physical Exam Vitals and nursing note reviewed.  Constitutional:      Appearance: He is well-developed and well-nourished.  HENT:     Head: Normocephalic and atraumatic.  Eyes:     Conjunctiva/sclera: Conjunctivae normal.  Cardiovascular:     Rate and Rhythm: Normal rate and regular rhythm.     Heart sounds: No murmur heard.   Pulmonary:     Effort: Pulmonary effort is normal. No respiratory distress.     Comments: Slightly diminished at bases, no increased work of breathing Abdominal:     Palpations: Abdomen is soft.     Tenderness: There is no abdominal tenderness.  Musculoskeletal:        General: No edema.     Cervical back: Neck supple.  Skin:    General: Skin is warm and dry.  Neurological:     General: No focal deficit present.     Mental Status: He is alert.  Psychiatric:        Mood and Affect: Mood and affect normal.     ED Results / Procedures / Treatments   Labs (all labs ordered are listed, but only abnormal results are displayed) Labs Reviewed  BASIC METABOLIC PANEL - Abnormal; Notable for the following components:      Result Value   CO2 20 (*)    Glucose, Bld 129 (*)    BUN 24 (*)    Creatinine, Ser 1.60 (*)    GFR, Estimated 50 (*)    All other components within normal limits  CBC - Abnormal; Notable for the following components:   RBC 6.68 (*)    MCV 71.1 (*)    MCH 23.8 (*)    RDW 17.2 (*)    All other components within normal limits  D-DIMER, QUANTITATIVE (NOT AT [facility]) - Abnormal; Notable for the following components:   D-Dimer, Quant 0.56 (*)    All other components within normal limits  TROPONIN I (HIGH SENSITIVITY) - Abnormal; Notable for the  following components:   Troponin I (High Sensitivity) 21 (*)    All other components within normal limits  TROPONIN I (HIGH SENSITIVITY) - Abnormal; Notable for the following components:   Troponin I (High Sensitivity) 22 (*)    All other components within normal limits    EKG EKG Interpretation  Date/Time:  Sunday April 29 2020 09:36:45 EST Ventricular Rate:  111 PR Interval:  180 QRS Duration: 108 QT Interval:  356 QTC Calculation: 484 R Axis:   -  167 Text Interpretation: Sinus tachycardia Pulmonary disease pattern Right bundle branch block Cannot rule out Inferior infarct , age undetermined Abnormal ECG No acute changes Confirmed by Marianna Fuss ([id number]) on 04/29/2020 11:35:13 AM   Radiology DG Chest Portable 1 View  Result Date: 04/29/2020 CLINICAL DATA:  Shortness of breath.  COVID positive. EXAM: PORTABLE CHEST 1 VIEW COMPARISON:  Two-view chest x-ray 04/26/2020 FINDINGS: Heart size is normal, exaggerated by low lung volumes. Asymmetric left-sided airspace disease is similar the prior study. No edema or effusion is present. No pneumothorax is present. IMPRESSION: Stable asymmetric left-sided airspace disease consistent with pneumonia. Electronically Signed   By: Marin Roberts M.D.   On: 04/29/2020 10:21    Procedures Procedures   Medications Ordered in ED Medications  sodium chloride 0.9 % bolus 1,000 mL (0 mLs Intravenous Stopped 04/29/20 1333)    ED Course  I have reviewed the triage vital signs and the nursing notes.  Pertinent labs & imaging results that were available during my care of the patient were reviewed by me and considered in my medical decision making (see chart for details).    MDM Rules/Calculators/A&P                         60 year old male presenting to ER with concern for worsening shortness of breath in the setting of known COVID-19.  On exam, patient was well-appearing in no acute distress.  Noted mild tachycardia.  CXR with pneumonia.   Suspect most likely from COVID-19.  Already given course of ABX from urgent care.  EKG without acute ischemic changes.  Initial troponin was 21 and second was 22.  No chest pain, doubt ACS.  D-dimer was 0.56.  This is within a normal range when adjusting for his age.  Given this finding, no chest pain, no hypoxia, I have a very low suspicion for acute pulmonary embolism at this time.  His labs were also noted for mild elevation in creatinine, suspect secondary to dehydration.  Provided fluids.  On reassessment, patient feels somewhat better.  Did well on ambulation trial.  Believe he is appropriate for discharge and outpatient management.  Recommended follow-up with primary doctor or [place] COVID clinic.  After the discussed management above, the patient was determined to be safe for discharge.  The patient was in agreement with this plan and all questions regarding their care were answered.  ED return precautions were discussed and the patient will return to the ED with any significant worsening of condition.  Loraine Freid was evaluated in Emergency Department on 04/29/2020 for the symptoms described in the history of present illness. He was evaluated in the context of the global COVID-19 pandemic, which necessitated consideration that the patient might be at risk for infection with the SARS-CoV-2 virus that causes COVID-19. Institutional protocols and algorithms that pertain to the evaluation of patients at risk for COVID-19 are in a state of rapid change based on information released by regulatory bodies including the CDC and federal and state organizations. These policies and algorithms were followed during the patient's care in the ED.  Final Clinical Impression(s) / ED Diagnoses Final diagnoses:  COVID-19    Rx / DC Orders ED Discharge Orders         Ordered    Ambulatory referral for Covid Treatment        04/29/20 1324           Milagros Loll, MD 04/29/20 1409

## 2020-04-29 NOTE — Discharge Instructions (Signed)
Follow-up with your primary doctor.  If you do not have 1, please call the pulmonary clinic for follow-up.  Return to ER if you have worsening difficulty breathing, chest pain, or other new concerning symptom.

## 2020-04-29 NOTE — ED Notes (Signed)
Pt's walking Sa02 was 93% on RA

## 2020-04-29 NOTE — ED Triage Notes (Signed)
Pt states he tested + for COVID 2-3 days ago.  C/o SOB.

## 2020-04-30 ENCOUNTER — Other Ambulatory Visit: Payer: Self-pay | Admitting: Adult Health

## 2020-04-30 DIAGNOSIS — U071 COVID-19: Secondary | ICD-10-CM

## 2020-04-30 NOTE — Progress Notes (Signed)
I connected by phone with Julian [name] on 04/30/2020 at 2:10 PM to discuss the potential use of a new treatment for mild to moderate COVID-19 viral infection in non-hospitalized patients.  This patient is a 60 y.o. male that meets the FDA criteria for Emergency Use Authorization of COVID monoclonal antibody sotrovimab.  Has a (+) direct SARS-CoV-2 viral test result  Has mild or moderate COVID-19   Is NOT hospitalized due to COVID-19  Is within 10 days of symptom onset  Has at least one of the high risk factor(s) for progression to severe COVID-19 and/or hospitalization as defined in EUA.  Specific high risk criteria : BMI > 25 and Cardiovascular disease or hypertension   I have spoken and communicated the following to the patient or parent/caregiver regarding COVID monoclonal antibody treatment:  1. FDA has authorized the emergency use for the treatment of mild to moderate COVID-19 in adults and pediatric patients with positive results of direct SARS-CoV-2 viral testing who are 61 years of age and older weighing at least 40 kg, and who are at high risk for progressing to severe COVID-19 and/or hospitalization.  2. The significant known and potential risks and benefits of COVID monoclonal antibody, and the extent to which such potential risks and benefits are unknown.  3. Information on available alternative treatments and the risks and benefits of those alternatives, including clinical trials.  4. Patients treated with COVID monoclonal antibody should continue to self-isolate and use infection control measures (e.g., wear mask, isolate, social distance, avoid sharing personal items, clean and disinfect "high touch" surfaces, and frequent handwashing) according to CDC guidelines.   5. The patient or parent/caregiver has the option to accept or refuse COVID monoclonal antibody treatment. 6. Patient reports sx onset of 04/22/2020; cost reviewed  After reviewing this information with the  patient, the patient has agreed to receive one of the available covid 19 monoclonal antibodies and will be provided an appropriate fact sheet prior to infusion. [name], NP 04/30/2020 2:10 PM

## 2020-05-01 ENCOUNTER — Ambulatory Visit (HOSPITAL_COMMUNITY)
Admission: RE | Admit: 2020-05-01 | Discharge: 2020-05-01 | Disposition: A | Discharge status: Home / Self Care | Payer: HRSA Program | Source: Ambulatory Visit | Attending: Pulmonary Disease | Admitting: Pulmonary Disease

## 2020-05-01 DIAGNOSIS — I10 Essential (primary) hypertension: Secondary | ICD-10-CM | POA: Insufficient documentation

## 2020-05-01 DIAGNOSIS — Z6825 Body mass index (BMI) 25.0-25.9, adult: Secondary | ICD-10-CM | POA: Insufficient documentation

## 2020-05-01 DIAGNOSIS — U071 COVID-19: Secondary | ICD-10-CM | POA: Diagnosis not present

## 2020-05-01 MED ORDER — ALBUTEROL SULFATE HFA 108 (90 BASE) MCG/ACT IN AERS: 2.0000 | INHALATION_SPRAY | Freq: Once | RESPIRATORY_TRACT | Status: DC | PRN

## 2020-05-01 MED ORDER — DIPHENHYDRAMINE HCL 50 MG/ML IJ SOLN: 50.0000 mg | Freq: Once | INTRAMUSCULAR | Status: DC | PRN

## 2020-05-01 MED ORDER — EPINEPHRINE 0.3 MG/0.3ML IJ SOAJ: 0.3000 mg | Freq: Once | INTRAMUSCULAR | Status: DC | PRN

## 2020-05-01 MED ORDER — SOTROVIMAB 500 MG/8ML IV SOLN
500.0000 mg | Freq: Once | INTRAVENOUS | Status: AC
  Administered 2020-05-01: 500 mg via INTRAVENOUS

## 2020-05-01 MED ORDER — SODIUM CHLORIDE 0.9 % IV SOLN: INTRAVENOUS | Status: DC | PRN

## 2020-05-01 MED ORDER — METHYLPREDNISOLONE SODIUM SUCC 125 MG IJ SOLR: 125.0000 mg | Freq: Once | INTRAMUSCULAR | Status: DC | PRN

## 2020-05-01 MED ORDER — FAMOTIDINE IN NACL 20-0.9 MG/50ML-% IV SOLN: 20.0000 mg | Freq: Once | INTRAVENOUS | Status: DC | PRN

## 2020-05-01 NOTE — Progress Notes (Signed)
Diagnosis: COVID-19  Physician: Dr. Patrick Wright  Procedure: Covid Infusion Clinic Med: Sotrovimab infusion - Provided patient with sotrovimab fact sheet for patients, parents, and caregivers prior to infusion.   Complications: No immediate complications noted  Discharge: Discharged home    

## 2020-05-01 NOTE — Discharge Instructions (Signed)

## 2020-05-01 NOTE — Progress Notes (Signed)
Patient reviewed Fact Sheet for Patients, Parents, and Caregivers for Emergency Use Authorization (EUA) of sotrovimab for the Treatment of Coronavirus. Patient also reviewed and is agreeable to the estimated cost of treatment. Patient is agreeable to proceed.   

## 2020-07-30 DIAGNOSIS — J302 Other seasonal allergic rhinitis: Secondary | ICD-10-CM | POA: Insufficient documentation

## 2020-07-30 DIAGNOSIS — I10 Essential (primary) hypertension: Secondary | ICD-10-CM | POA: Insufficient documentation

## 2020-09-10 ENCOUNTER — Other Ambulatory Visit: Payer: Self-pay

## 2020-09-10 ENCOUNTER — Encounter (HOSPITAL_COMMUNITY): Payer: Self-pay | Admitting: Emergency Medicine

## 2020-09-10 ENCOUNTER — Ambulatory Visit (HOSPITAL_COMMUNITY)
Admission: EM | Admit: 2020-09-10 | Discharge: 2020-09-10 | Disposition: A | Discharge status: Home / Self Care | Payer: 59 | Attending: Family Medicine | Admitting: Family Medicine

## 2020-09-10 DIAGNOSIS — L720 Epidermal cyst: Secondary | ICD-10-CM | POA: Diagnosis not present

## 2020-09-10 MED ORDER — LIDOCAINE HCL (PF) 1 % IJ SOLN
INTRAMUSCULAR | Status: AC
  Filled 2020-09-10: qty 30

## 2020-09-10 MED ORDER — DOXYCYCLINE HYCLATE 100 MG PO CAPS: 100.0000 mg | ORAL_CAPSULE | Freq: Two times a day (BID) | ORAL | 0 refills | Status: DC

## 2020-09-10 NOTE — Discharge Instructions (Addendum)
We have drained the infection from your cyst today  I have sent in doxycycline for you to take one tablet twice a day for 7 days  Follow up with PCP for possible surgical referral

## 2020-09-10 NOTE — ED Triage Notes (Signed)
Patient c/o abscess in the middle of his back for several months.  Now the area has some redness around it, tried squeezing it, no drainage, painful.  Patient taken Tylenol.

## 2020-09-10 NOTE — ED Provider Notes (Addendum)
[facility]    CSN: [id number] Arrival date & time: 09/10/20  6237      History   Chief Complaint Chief Complaint  Patient presents with   Abscess    HPI Julian [name] is a 60 y.o. male.   Reports abscess to the middle of his back for the last few months. Reports that recently the area has gotten erythematous and painful. Has tried to open the area at home with no success. Reports previous symptoms. There are not aggravating or alleviating factors. Denies headache, chills, body aches, abdominal pain, nausea, vomiting, diarrhea, fever, other symptoms.  ROS per HPI  The history is provided by the patient.  Abscess  Past Medical History:  Diagnosis Date   Hyperlipidemia    Hypertension    Seasonal allergies     There are no problems to display for this patient.   Past Surgical History:  Procedure Laterality Date   RADIOLOGY WITH ANESTHESIA N/A 02/28/2020   Procedure: MRI WITH ANESTHESIA LUMBAR SPINE WITHOUT CONTRAST;  Surgeon: Radiologist, Medication, MD;  Location: [facility];  Service: Radiology;  Laterality: N/A;       Home Medications    Prior to Admission medications   Medication Sig Start Date End Date Taking? Authorizing Provider  acetaminophen (TYLENOL) 500 MG tablet Take 500-1,000 mg by mouth every 6 (six) hours as needed (for pain.).   Yes [provider]  amLODipine (NORVASC) 10 MG tablet Take 1 tablet (10 mg total) by mouth daily. 04/26/20  Yes Rhys Martini, [name]  cetirizine (ZYRTEC) 10 MG tablet Take 10 mg by mouth daily.   Yes [provider]  fluticasone (FLONASE) 50 MCG/ACT nasal spray Place 1 spray into both nostrils daily. 04/26/20  Yes Rhys Martini, [name]  gabapentin (NEURONTIN) 300 MG capsule Take 300 mg by mouth at bedtime.   Yes [provider]  loratadine (CLARITIN) 10 MG tablet Take 1 tablet (10 mg total) by mouth daily. 04/26/20  Yes Rhys Martini, [name]  losartan-hydrochlorothiazide (HYZAAR) 50-12.5 MG  tablet Take 1 tablet by mouth daily. 04/26/20  Yes Rhys Martini, [name]  Misc. Devices (PULSE OXIMETER FOR FINGER) MISC 1 each by Does not apply route in the morning and at bedtime. 04/26/20  Yes Rhys Martini, [name]  Misc. Devices (PULSE OXIMETER) MISC 1 each by Does not apply route in the morning and at bedtime. 04/26/20  Yes Rhys Martini, [name]  nabumetone (RELAFEN) 750 MG tablet Take 750 mg by mouth in the morning and at bedtime.   Yes [provider]  tiZANidine (ZANAFLEX) 4 MG tablet Take 4 mg by mouth 3 (three) times daily as needed for muscle spasms.   Yes [provider]  azithromycin (ZITHROMAX Z-PAK) 250 MG tablet Take 1 tablet (250 mg total) by mouth daily. Z-pack: take two pills (500mg ) on day 1. Take 1 pill (250mg ) on days 2-4. 04/26/20   , [name]  benzonatate (TESSALON) 100 MG capsule Take 1 capsule (100 mg total) by mouth every 8 (eight) hours. 04/26/20   Rhys Martini, [name]    Family History Family History  Problem Relation Age of Onset   Healthy Mother    Healthy Father     Social History Social History   Tobacco Use   Smoking status: Former    Pack years: 0.00   Smokeless tobacco: Never  Vaping Use   Vaping Use: Never used  Substance Use Topics   Alcohol use: Never  Drug use: Never     Allergies   Patient has no known allergies.   Review of Systems Review of Systems   Physical Exam Triage Vital Signs ED Triage Vitals  Enc Vitals Group     BP 09/10/20 0831 (!) 158/115     Pulse Rate 09/10/20 0831 94     Resp --      Temp 09/10/20 0831 99.1 F (37.3 C)     Temp Source 09/10/20 0831 Oral     SpO2 09/10/20 0831 97 %     Weight --      Height --      Head Circumference --      Peak Flow --      Pain Score 09/10/20 0832 8     Pain Loc --      Pain Edu? --      Excl. in GC? --    No data found.  Updated Vital Signs BP (!) 158/115 (BP Location: Right Arm)   Pulse 94   Temp 99.1 F (37.3 C) (Oral)   SpO2 97%    Visual Acuity Right Eye Distance:   Left Eye Distance:   Bilateral Distance:    Right Eye Near:   Left Eye Near:    Bilateral Near:     Physical Exam Vitals and nursing note reviewed.  Constitutional:      Appearance: Normal appearance. He is well-developed.  HENT:     Head: Normocephalic and atraumatic.     Nose: Nose normal.     Mouth/Throat:     Mouth: Mucous membranes are moist.     Pharynx: Oropharynx is clear.  Eyes:     Extraocular Movements: Extraocular movements intact.     Conjunctiva/sclera: Conjunctivae normal.     Pupils: Pupils are equal, round, and reactive to light.  Cardiovascular:     Rate and Rhythm: Normal rate and regular rhythm.  Pulmonary:     Effort: Pulmonary effort is normal. No respiratory distress.  Musculoskeletal:        General: Normal range of motion.     Cervical back: Normal range of motion and neck supple.  Skin:    General: Skin is warm and dry.     Capillary Refill: Capillary refill takes less than 2 seconds.     Findings: Lesion present.       Neurological:     General: No focal deficit present.     Mental Status: He is alert and oriented to person, place, and time.  Psychiatric:        Mood and Affect: Mood normal.        Behavior: Behavior normal.        Thought Content: Thought content normal.     UC Treatments / Results  Labs (all labs ordered are listed, but only abnormal results are displayed) Labs Reviewed - No data to display  EKG   Radiology No results found.  Procedures Incision and Drainage  Date/Time: 09/10/2020 9:17 AM Performed by: Moshe Cipro, NP Authorized by: Moshe Cipro, NP   Consent:    Consent obtained:  Verbal   Consent given by:  Patient   Risks discussed:  Incomplete drainage and infection   Alternatives discussed:  No treatment Universal protocol:    Patient identity confirmed:  Verbally with patient and arm band Location:    Type:  Cyst   Size:  2 cm   Location:   Trunk Sedation:    Sedation type:  None Anesthesia:  Anesthesia method:  Local infiltration   Local anesthetic:  Lidocaine 1% w/o epi Procedure type:    Complexity:  Simple Procedure details:    Ultrasound guidance: no     Needle aspiration: no     Incision types:  Single straight   Incision depth:  Dermal   Wound management:  Probed and deloculated   Drainage:  Bloody and purulent   Drainage amount:  Scant   Wound treatment:  Wound left open   Packing materials:  None Post-procedure details:    Procedure completion:  Tolerated well, no immediate complications (including critical care time)  Medications Ordered in UC Medications - No data to display  Initial Impression / Assessment and Plan / UC Course  I have reviewed the triage vital signs and the nursing notes.  Pertinent labs & imaging results that were available during my care of the patient were reviewed by me and considered in my medical decision making (see chart for details).    Epidermoid Cyst of Back  I&D in office as above Tolerated well Prescribed doxycycline 100mg  BID x 7 days May apply warm compresses to the area at home Follow up with derm/general surgery  Final Clinical Impressions(s) / UC Diagnoses   Final diagnoses:  Epidermoid cyst of skin of back     Discharge Instructions      We have drained the infection from your cyst today  I have sent in doxycycline for you to take one tablet twice a day for 7 days  Follow up with PCP for possible surgical referral      ED Prescriptions   None    PDMP not reviewed this encounter.   , NP 09/10/20 09/12/20    3016, NP 09/10/20 [phone]

## 2020-09-17 DIAGNOSIS — M48061 Spinal stenosis, lumbar region without neurogenic claudication: Secondary | ICD-10-CM | POA: Insufficient documentation

## 2020-10-22 HISTORY — PX: LUMBAR SPINE SURGERY: SHX701

## 2021-02-12 ENCOUNTER — Other Ambulatory Visit (HOSPITAL_COMMUNITY): Payer: Self-pay | Admitting: Orthopedic Surgery

## 2021-02-12 DIAGNOSIS — M47816 Spondylosis without myelopathy or radiculopathy, lumbar region: Secondary | ICD-10-CM

## 2021-02-12 DIAGNOSIS — M545 Low back pain, unspecified: Secondary | ICD-10-CM

## 2021-04-23 ENCOUNTER — Other Ambulatory Visit: Payer: Self-pay

## 2021-04-23 ENCOUNTER — Encounter (HOSPITAL_COMMUNITY): Payer: Self-pay | Admitting: *Deleted

## 2021-04-23 NOTE — Progress Notes (Signed)
I left  Denny Peon, RN, BSN a voice message informing that we have received H/P dated 04/10/21, vital signs are missing.  I asked Darl Pikes to call me back with the plan to obtain vital signs so that Mr. Bearden may procedure with MRI on 04/25/21.

## 2021-04-23 NOTE — H&P (View-Only) (Signed)
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 He is s/p left L4-5 decompression/laminectomy ~ 11/13/20, but still with persistent numbness in his feet and pain with increased standing. MRI L-spine ordered at 02/07/21 visit, but required updated visit 04/10/21 by Doretha Sou, [name] at [facility] since MRI planned under anesthesia on 04/24/21. (Notes can be viewed in [place])   History includes former smoker, HTN, HLD, pre-diabetes, COVID-19 (03/2020, treated out-patient for PNA), spinal surgery (left L4-5 decompression/laminectomy ~ 11/13/20).  BP was elevated on 09/10/20 (ED visit of epidermoid cyst on back, s/p I&D). There are no recent comparison BPs in [facility]/Care Everywhere, but he did report last home BP 139/80. He also had back surgery in August. He denied chest pain and SOB per PAT RN phone interview. By medication list, he is on amlodipine 10 mg daily, but is not taking losartan-HCTZ 50-12.5 mg daily. He says he takes his medications at night.   He will get vitals on arrival. Labs and anesthesia team evaluation on the day of procedure.    VS: BP and HR not recorded at 04/10/21 orthopedic visit. WT recorded as 115 kg, HT 5\' 10" , BMI 36.3. BP Readings from Last 3 Encounters:  09/10/20 (!) 158/115  05/01/20 (!) 142/87  04/29/20 111/80   Pulse Readings from Last 3 Encounters:  09/10/20 94  05/01/20 91  04/29/20 (!) 102     PROVIDERS: PCP is Dr. Vanita Panda at [facility]. Last office visit requested, but is pending.   LABS: For day of procedure as indicated. Creatinine 1.60 on  04/29/20, previously 1.09-1.33 in 2021.    IMAGES: 1V PCXR 04/29/20 (in setting of COVID): IMPRESSION: Stable asymmetric left-sided airspace disease consistent with pneumonia.   EKG: 04/30/20: Sinus tachycardia at 111 bpm Pulmonary disease pattern Right bundle branch block Cannot rule out Inferior infarct , age undetermined Abnormal ECG No acute changes [when compared to 02/28/20 tracing] Confirmed by Madalyn Rob [phone]) on 04/29/2020 11:35:13 AM   CV: N/A   Past Medical History:  Diagnosis Date   Arthritis    a little in back   Depression    situational-out of work 2 years   Hyperlipidemia    Hypertension    Pre-diabetes    Seasonal allergies     Past Surgical History:  Procedure Laterality Date   LUMBAR SPINE SURGERY  10/2020   RADIOLOGY WITH ANESTHESIA N/A 02/28/2020   Procedure: MRI WITH ANESTHESIA LUMBAR SPINE WITHOUT CONTRAST;  Surgeon: Radiologist, Medication, MD;  Location: [place];  Service: Radiology;  Laterality: N/A;    MEDICATIONS: No current facility-administered medications for this encounter.    acetaminophen (TYLENOL) 500 MG tablet   amLODipine (NORVASC) 10 MG tablet   diclofenac (VOLTAREN) 75 MG EC tablet   fluticasone (FLONASE) 50 MCG/ACT nasal spray   gabapentin (NEURONTIN) 300 MG capsule   lisinopril (ZESTRIL) 40 MG tablet   loratadine (CLARITIN) 10 MG tablet   Multiple Vitamins-Minerals (ONE-A-DAY MENS 50+) TABS   oxyCODONE-acetaminophen (PERCOCET) 7.5-325 MG tablet   tiZANidine (ZANAFLEX) 4 MG tablet   zinc gluconate 50 MG tablet   azithromycin (ZITHROMAX Z-PAK) 250  MG tablet   benzonatate (TESSALON) 100 MG capsule   doxycycline (VIBRAMYCIN) 100 MG capsule   losartan-hydrochlorothiazide (HYZAAR) 50-12.5 MG tablet   Misc. Devices (PULSE OXIMETER FOR FINGER) MISC   Misc. Devices (PULSE OXIMETER) MISC  Per current medication list he is not taking azithromycin, Tessalon, doxycycline, Hyzaar.   Myra Gianotti, [name] Surgical Short  Stay/Anesthesiology [facility] Phone [phone] [facility] Phone [phone] 04/23/2021 2:40 PM

## 2021-04-23 NOTE — Anesthesia Preprocedure Evaluation (Addendum)
Anesthesia Evaluation  Patient identified by MRN, date of birth, ID band Patient awake    Reviewed: Allergy & Precautions, H&P , NPO status , Patient's Chart, lab work & pertinent test results  Airway Mallampati: II   Neck ROM: full    Dental   Pulmonary former smoker,    breath sounds clear to auscultation       Cardiovascular hypertension,  Rhythm:regular Rate:Normal     Neuro/Psych PSYCHIATRIC DISORDERS Depression    GI/Hepatic   Endo/Other    Renal/GU      Musculoskeletal  (+) Arthritis ,   Abdominal   Peds  Hematology   Anesthesia Other Findings   Reproductive/Obstetrics                            Anesthesia Physical Anesthesia Plan  ASA: 2  Anesthesia Plan: General   Post-op Pain Management:    Induction: Intravenous  PONV Risk Score and Plan: 2 and Ondansetron, Dexamethasone, Midazolam and Treatment may vary due to age or medical condition  Airway Management Planned: Oral ETT  Additional Equipment:   Intra-op Plan:   Post-operative Plan: Extubation in OR  Informed Consent: I have reviewed the patients History and Physical, chart, labs and discussed the procedure including the risks, benefits and alternatives for the proposed anesthesia with the patient or authorized representative who has indicated his/her understanding and acceptance.     Dental advisory given  Plan Discussed with: CRNA, Anesthesiologist and Surgeon  Anesthesia Plan Comments: (PAT note written 04/23/2021 by Myra Gianotti, [name]. )       Anesthesia Quick Evaluation

## 2021-04-23 NOTE — Progress Notes (Addendum)
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 He is s/p left L4-5 decompression/laminectomy ~ 11/13/20, but still with persistent numbness in his feet and pain with increased standing. MRI L-spine ordered at 02/07/21 visit, but required updated visit 04/10/21 by [name], [name], [name] at [facility] since MRI planned under anesthesia on 04/24/21. (Notes can be viewed in Care Everywhere)  ° °History includes former smoker, HTN, HLD, pre-diabetes, COVID-19 (03/2020, treated out-patient for PNA), spinal surgery (left L4-5 decompression/laminectomy ~ 11/13/20). ° °BP was elevated on 09/10/20 (ED visit of epidermoid cyst on back, s/p I&D). There are no recent comparison BPs in [facility]/Care Everywhere, but he did report last home BP 139/80. He also had back surgery in August. He denied chest pain and SOB per PAT RN phone interview. By medication list, he is on amlodipine 10 mg daily, but is not taking losartan-HCTZ 50-12.5 mg daily. He says he takes his medications at night.  ° °He will get vitals on arrival. Labs and anesthesia team evaluation on the day of procedure.  ° ° °VS: BP and HR not recorded at 04/10/21 orthopedic visit. WT recorded as 115 kg, HT 5' 10", BMI 36.3. °BP Readings from Last 3 Encounters:  °09/10/20 (!) 158/115  °05/01/20 (!) 142/87  °04/29/20 111/80  ° °Pulse Readings from Last 3 Encounters:  °09/10/20 94  °05/01/20 91  °04/29/20 (!) 102  °  ° °PROVIDERS: °PCP is Dr. [name] at [facility]. Last office visit requested, but is pending. ° ° °LABS: For day of procedure as indicated. Creatinine 1.60 on  04/29/20, previously 1.09-1.33 in 2021.  ° ° °IMAGES: °1V PCXR 04/29/20 (in setting of COVID): °IMPRESSION: °Stable asymmetric left-sided airspace disease consistent with °pneumonia. ° ° °EKG: 04/30/20: °Sinus tachycardia at 111 bpm °Pulmonary disease pattern °Right bundle branch block °Cannot rule out Inferior infarct , age undetermined °Abnormal ECG °No acute changes [when compared to 02/28/20 tracing] °Confirmed by Dykstra, Richard ([id number]) on 04/29/2020 11:35:13 AM ° ° °CV: N/A ° ° °Past Medical History:  °Diagnosis Date  ° Arthritis   ° a little in back  ° Depression   ° situational-out of work 2 years  ° Hyperlipidemia   ° Hypertension   ° Pre-diabetes   ° Seasonal allergies   ° ° °Past Surgical History:  °Procedure Laterality Date  ° LUMBAR SPINE SURGERY  10/2020  ° RADIOLOGY WITH ANESTHESIA N/A 02/28/2020  ° Procedure: MRI WITH ANESTHESIA LUMBAR SPINE WITHOUT CONTRAST;  Surgeon: Radiologist, Medication, MD;  [facility];  Service: Radiology;  Laterality: N/A;  ° ° °MEDICATIONS: °No current facility-administered medications for this encounter.  ° ° acetaminophen (TYLENOL) 500 MG tablet  ° amLODipine (NORVASC) 10 MG tablet  ° diclofenac (VOLTAREN) 75 MG EC tablet  ° fluticasone (FLONASE) 50 MCG/ACT nasal spray  ° gabapentin (NEURONTIN) 300 MG capsule  ° lisinopril (ZESTRIL) 40 MG tablet  ° loratadine (CLARITIN) 10 MG tablet  ° Multiple Vitamins-Minerals (ONE-A-DAY MENS 50+) TABS  ° oxyCODONE-acetaminophen (PERCOCET) 7.5-325 MG tablet  ° tiZANidine (ZANAFLEX) 4 MG tablet  ° zinc gluconate 50 MG tablet  ° azithromycin (ZITHROMAX Z-PAK) 250   MG tablet  ° benzonatate (TESSALON) 100 MG capsule  ° doxycycline (VIBRAMYCIN) 100 MG capsule  ° losartan-hydrochlorothiazide (HYZAAR) 50-12.5 MG tablet  ° Misc. Devices (PULSE OXIMETER FOR FINGER) MISC  ° Misc. Devices (PULSE OXIMETER) MISC  °Per current medication list he is not taking azithromycin, Tessalon, doxycycline, Hyzaar. ° ° °Julian Verdell, [name] °Surgical Short  Stay/Anesthesiology °[facility] Phone [phone] °[facility] Phone [phone] °04/23/2021 2:40 PM ° ° ° ° ° ° ° °

## 2021-04-23 NOTE — Progress Notes (Signed)
Julian [name] denies chest pain or shortness of breath.  Patient denies having any s/s of Covid in his household.  Patient denies any known exposure to Covid.   Mr. Julian [name] PCP is Dr. Payton Emerald at [facility].  I will request records- last office visit labs, ? EKG.  Mr. Julian [name] states that he checks his blood pressure at home. !39/ 80 is a recent number, my blood pressure is always higher in the Dr's office. Mr. Julian [name] takes his medications at night.  I instructed Mr.Julian [name]  to shower with antibiotic soap, if it is available.  Dry off with a clean towel. Do not put lotion, powder, cologne or deodorant or makeup.No jewelry or piercings. Men may shave their face and neck. Woman should not shave. No nail polish, artificial or acrylic nails. Wear clean clothes, brush your teeth. Glasses, contact lens,dentures or partials may not be worn in the OR. If you need to wear them, please bring a case for glasses, do not wear contacts or bring a case, the hospital does not have contact cases, dentures or partials will have to be removed , make sure they are clean, we will provide a denture cup to put them in. You will need some one to drive you home and a responsible person over the age of 48 to stay with you for the first 24 hours after surgery.

## 2021-04-25 ENCOUNTER — Other Ambulatory Visit: Payer: Self-pay

## 2021-04-25 ENCOUNTER — Ambulatory Visit (HOSPITAL_COMMUNITY): Payer: No Typology Code available for payment source | Admitting: Vascular Surgery

## 2021-04-25 ENCOUNTER — Encounter (HOSPITAL_COMMUNITY): Admission: RE | Disposition: A | Discharge status: Home / Self Care | Payer: Self-pay | Source: Ambulatory Visit

## 2021-04-25 ENCOUNTER — Encounter (HOSPITAL_COMMUNITY): Payer: Self-pay

## 2021-04-25 ENCOUNTER — Ambulatory Visit (HOSPITAL_COMMUNITY)
Admission: RE | Admit: 2021-04-25 | Discharge: 2021-04-25 | Disposition: A | Discharge status: Home / Self Care | Payer: No Typology Code available for payment source | Source: Ambulatory Visit | Attending: Orthopedic Surgery | Admitting: Orthopedic Surgery

## 2021-04-25 DIAGNOSIS — M545 Low back pain, unspecified: Secondary | ICD-10-CM | POA: Diagnosis not present

## 2021-04-25 DIAGNOSIS — Z87891 Personal history of nicotine dependence: Secondary | ICD-10-CM | POA: Diagnosis not present

## 2021-04-25 DIAGNOSIS — M79605 Pain in left leg: Secondary | ICD-10-CM | POA: Insufficient documentation

## 2021-04-25 DIAGNOSIS — R200 Anesthesia of skin: Secondary | ICD-10-CM | POA: Insufficient documentation

## 2021-04-25 DIAGNOSIS — Z9889 Other specified postprocedural states: Secondary | ICD-10-CM | POA: Diagnosis not present

## 2021-04-25 DIAGNOSIS — M47816 Spondylosis without myelopathy or radiculopathy, lumbar region: Secondary | ICD-10-CM

## 2021-04-25 DIAGNOSIS — I10 Essential (primary) hypertension: Secondary | ICD-10-CM | POA: Diagnosis not present

## 2021-04-25 DIAGNOSIS — M1990 Unspecified osteoarthritis, unspecified site: Secondary | ICD-10-CM | POA: Insufficient documentation

## 2021-04-25 HISTORY — PX: RADIOLOGY WITH ANESTHESIA: SHX6223

## 2021-04-25 HISTORY — DX: Unspecified osteoarthritis, unspecified site: M19.90

## 2021-04-25 HISTORY — DX: Prediabetes: R73.03

## 2021-04-25 HISTORY — DX: Depression, unspecified: F32.A

## 2021-04-25 LAB — BASIC METABOLIC PANEL
Anion gap: 10 (ref 5–15)
BUN: 13 mg/dL (ref 6–20)
CO2: 26 mmol/L (ref 22–32)
Calcium: 9.3 mg/dL (ref 8.9–10.3)
Chloride: 105 mmol/L (ref 98–111)
Creatinine, Ser: 1.33 mg/dL — ABNORMAL HIGH (ref 0.61–1.24)
GFR, Estimated: >60 mL/min (ref >60)
Glucose, Bld: 126 mg/dL — ABNORMAL HIGH (ref 70–99)
Potassium: 4 mmol/L (ref 3.5–5.1)
Sodium: 141 mmol/L (ref 135–145)

## 2021-04-25 LAB — GLUCOSE, CAPILLARY: Glucose-Capillary: 140 mg/dL — ABNORMAL HIGH (ref 70–99)

## 2021-04-25 SURGERY — MRI WITH ANESTHESIA
Anesthesia: General

## 2021-04-25 MED ORDER — ONDANSETRON HCL 4 MG/2ML IJ SOLN
INTRAMUSCULAR | Status: DC | PRN
  Administered 2021-04-25: 4 mg via INTRAVENOUS

## 2021-04-25 MED ORDER — CHLORHEXIDINE GLUCONATE 0.12 % MT SOLN: 15.0000 mL | Freq: Once | OROMUCOSAL | Status: AC

## 2021-04-25 MED ORDER — CHLORHEXIDINE GLUCONATE 0.12 % MT SOLN
OROMUCOSAL | Status: AC
  Administered 2021-04-25: 15 mL via OROMUCOSAL
  Filled 2021-04-25: qty 15

## 2021-04-25 MED ORDER — PROPOFOL 10 MG/ML IV BOLUS
INTRAVENOUS | Status: DC | PRN
Start: 2021-04-25 — End: 2021-04-25
  Administered 2021-04-25: 150 mg via INTRAVENOUS
  Administered 2021-04-25: 50 mg via INTRAVENOUS

## 2021-04-25 MED ORDER — LACTATED RINGERS IV SOLN: INTRAVENOUS | Status: DC

## 2021-04-25 MED ORDER — PHENYLEPHRINE 40 MCG/ML (10ML) SYRINGE FOR IV PUSH (FOR BLOOD PRESSURE SUPPORT)
PREFILLED_SYRINGE | INTRAVENOUS | Status: DC | PRN
  Administered 2021-04-25: 120 ug via INTRAVENOUS

## 2021-04-25 MED ORDER — DEXAMETHASONE SODIUM PHOSPHATE 10 MG/ML IJ SOLN
INTRAMUSCULAR | Status: DC | PRN
Start: 2021-04-25 — End: 2021-04-25
  Administered 2021-04-25: 5 mg via INTRAVENOUS

## 2021-04-25 MED ORDER — LIDOCAINE 2% (20 MG/ML) 5 ML SYRINGE
INTRAMUSCULAR | Status: DC | PRN
  Administered 2021-04-25: 60 mg via INTRAVENOUS

## 2021-04-25 MED ORDER — MIDAZOLAM HCL 2 MG/2ML IJ SOLN
INTRAMUSCULAR | Status: DC | PRN
  Administered 2021-04-25: 2 mg via INTRAVENOUS

## 2021-04-25 MED ORDER — ORAL CARE MOUTH RINSE: 15.0000 mL | Freq: Once | OROMUCOSAL | Status: AC

## 2021-04-25 MED ORDER — FENTANYL CITRATE (PF) 250 MCG/5ML IJ SOLN
INTRAMUSCULAR | Status: DC | PRN
  Administered 2021-04-25: 50 ug via INTRAVENOUS

## 2021-04-25 NOTE — Transfer of Care (Signed)
Immediate Anesthesia Transfer of Care Note  Patient: Julian [name]  Procedure(s) Performed: MRI WITH LUMBER SPINE WITHOUT CONTRAST  Patient Location: PACU  Anesthesia Type:General  Level of Consciousness: drowsy  Airway & Oxygen Therapy: Patient Spontanous Breathing and Patient connected to nasal cannula oxygen  Post-op Assessment: Report given to RN and Post -op Vital signs reviewed and stable  Post vital signs: Reviewed and stable  Last Vitals:  Vitals Value Taken Time  BP    Temp    Pulse 87 04/25/21 0904  Resp 13 04/25/21 0904  SpO2 97 % 04/25/21 0904  Vitals shown include unvalidated device data.  Last Pain:  Vitals:   04/25/21 0650  TempSrc:   PainSc: 7          Complications: No notable events documented.

## 2021-04-25 NOTE — Interval H&P Note (Signed)
Anesthesia H&P Update: History and Physical Exam reviewed; patient is OK for planned anesthetic and procedure. ? ?

## 2021-04-25 NOTE — Anesthesia Procedure Notes (Signed)
Procedure Name: LMA Insertion Date/Time: 04/25/2021 8:26 AM Performed by: Imagene Riches, CRNA Pre-anesthesia Checklist: Patient identified, Emergency Drugs available, Suction available and Patient being monitored Patient Re-evaluated:Patient Re-evaluated prior to induction Oxygen Delivery Method: Circle System Utilized Preoxygenation: Pre-oxygenation with 100% oxygen Induction Type: IV induction Ventilation: Mask ventilation without difficulty LMA: LMA inserted LMA Size: 5.0 Number of attempts: 1 Airway Equipment and Method: Bite block Placement Confirmation: positive ETCO2 Tube secured with: Tape Dental Injury: Teeth and Oropharynx as per pre-operative assessment

## 2021-04-26 ENCOUNTER — Encounter (HOSPITAL_COMMUNITY): Payer: Self-pay | Admitting: Radiology

## 2021-04-26 NOTE — Anesthesia Postprocedure Evaluation (Signed)
Anesthesia Post Note  Patient: Julian [name]  Procedure(s) Performed: MRI WITH LUMBER SPINE WITHOUT CONTRAST     Patient location during evaluation: PACU Anesthesia Type: General Level of consciousness: awake and alert Pain management: pain level controlled Vital Signs Assessment: post-procedure vital signs reviewed and stable Respiratory status: spontaneous breathing, nonlabored ventilation, respiratory function stable and patient connected to nasal cannula oxygen Cardiovascular status: blood pressure returned to baseline and stable Postop Assessment: no apparent nausea or vomiting Anesthetic complications: no   No notable events documented.  Last Vitals:  Vitals:   04/25/21 0908 04/25/21 0919  BP:  138/89  Pulse:  82  Resp:  16  Temp: 36.7 C   SpO2:  93%    Last Pain:  Vitals:   04/25/21 0919  TempSrc:   PainSc: 0-No pain                 Jearldean Gutt S

## 2022-09-11 DIAGNOSIS — E782 Mixed hyperlipidemia: Secondary | ICD-10-CM | POA: Insufficient documentation

## 2023-05-04 ENCOUNTER — Other Ambulatory Visit (HOSPITAL_COMMUNITY): Payer: Self-pay | Admitting: Urology

## 2023-05-04 DIAGNOSIS — C61 Malignant neoplasm of prostate: Secondary | ICD-10-CM

## 2023-05-18 NOTE — Progress Notes (Signed)
 GU Location of Tumor / Histology: Prostate Ca  If Prostate Cancer, Gleason Score is (4 + 3) and PSA is (9.21 on 01/28/2023)  Julian [name] presented as referral from Dr. Vilma Prader [facility]) elevated PSA.  Biopsies      05/19/2023 Dr. Laury Axon NM Bone Scan Whole Body CLINICAL DATA:  Prostate cancer. Known arthritis   IMPRESSION: Multifocal areas of uptake seen suggestive of osseous metastatic disease including the left humeral head, right superomedial iliac bone, sternum.  Areas of more degenerative type uptake elsewhere. Focus along the lower cervical spine is indeterminate. Please correlate with known history or cervical spine two-view x-ray.   05/19/2023 Dr. Vilma Prader CT Abdomen [place] with Contrast CLINICAL DATA:  Prostate cancer. Staging. * Tracking Code: BO *   IMPRESSION: Heterogeneous prostate. Please correlate for location of neoplasm. Is also some fullness of the seminal vesicles.  No developing abnormal lymph node enlargement identified in the abdomen and pelvis.  Slight asymmetric sclerosis along the superomedial right iliac bone compared to left. Please correlate with separate bone scan dictation.  Complex upper pole left-sided renal cystic lesion with a  enhancing nodular component. Bosniak 3 lesion. Please correlate with any prior or additional evaluation.   Fatty liver infiltration.   Small umbilical hernia involving small bowel without obstruction.  Mild fold thickening along the stomach. Please correlate with any symptoms  Past/Anticipated interventions by urology, if any:  04/27/2023 Dr. Vilma Prader   Past/Anticipated interventions by medical oncology, if any: NA  Weight changes, if any: {:18581}  IPSS: SHIM:  Bowel/Bladder complaints, if any: {:18581}   Nausea/Vomiting, if any: {:18581}  Pain issues, if any:  {:18581}  SAFETY ISSUES: Prior radiation? {:18581} Pacemaker/ICD? {:18581} Possible current  pregnancy? Male Is the patient on methotrexate? No  Current Complaints / other details:

## 2023-05-19 ENCOUNTER — Encounter (HOSPITAL_COMMUNITY)
Admission: RE | Admit: 2023-05-19 | Discharge: 2023-05-19 | Disposition: A | Discharge status: Home / Self Care | Payer: Medicaid Other | Source: Ambulatory Visit | Attending: Urology | Admitting: Urology

## 2023-05-19 ENCOUNTER — Ambulatory Visit (HOSPITAL_COMMUNITY)
Admission: RE | Admit: 2023-05-19 | Discharge: 2023-05-19 | Disposition: A | Discharge status: Home / Self Care | Payer: Medicaid Other | Source: Ambulatory Visit | Attending: Urology | Admitting: Urology

## 2023-05-19 ENCOUNTER — Ambulatory Visit (HOSPITAL_COMMUNITY)
Admission: RE | Admit: 2023-05-19 | Discharge: 2023-05-19 | Disposition: A | Discharge status: Home / Self Care | Payer: Medicaid Other | Source: Ambulatory Visit | Attending: Urology

## 2023-05-19 DIAGNOSIS — C61 Malignant neoplasm of prostate: Secondary | ICD-10-CM | POA: Diagnosis present

## 2023-05-19 MED ORDER — TECHNETIUM TC 99M MEDRONATE IV KIT
21.6000 | PACK | Freq: Once | INTRAVENOUS | Status: AC | PRN
  Administered 2023-05-19: 21.6 via INTRAVENOUS

## 2023-05-19 MED ORDER — IOHEXOL 300 MG/ML  SOLN
100.0000 mL | Freq: Once | INTRAMUSCULAR | Status: AC | PRN
  Administered 2023-05-19: 100 mL via INTRAVENOUS

## 2023-05-20 NOTE — Progress Notes (Signed)
 Radiation Oncology         [phone]) [phone] ________________________________  Initial Outpatient Consultation  Name: Julian [name] MRN: [id number]  Date: 05/22/2023  DOB: Oct 04, 1960  VH:QIONGEX, No Pcp Per  Jennette Bill Scherrie Merritts, MD   REFERRING PHYSICIAN: Adonis Brook, MD  DIAGNOSIS: 63 y.o. gentleman with Stage T1c adenocarcinoma of the prostate with Gleason score of 4+3, and PSA of 9.21.    ICD-10-CM   1. Malignant neoplasm of prostate (HCC)  C61 NM PET (PSMA) SKULL TO MID THIGH      HISTORY OF PRESENT ILLNESS: Julian [name] is a 63 y.o. male with a diagnosis of prostate cancer. He was initially referred to Dr. Benancio Deeds at [facility] back in 09/2020 for an elevated PSA of 5.4. His DRE was normal so the recommendation was to return for repeat PSA in 6 months, but he did not follow up. More recently, he was noted to have a further rise in PSA to 8.3 by his primary care provider, Quita Skye, NP.  Accordingly, he was referred back to Dr. Benancio Deeds for further evaluation on 10/09/22,  digital rectal examination was performed at that time revealing no nodules or induration. He underwent a repeat PSA in 12/2022, which remained persistently elevated at 8.63, and again in 01/2023, which showed a slight rise to 9.21.  He met with Dr. Jennette Bill on 02/25/23 and elected to proceed with transrectal ultrasound with 12 biopsies of the prostate, performed on 04/20/23.  The prostate volume measured 30.9 cc.  Out of 12 core biopsies, 4 were positive.  The maximum Gleason score was 4+3, and this was seen in the right mid lateral. Additionally, Gleason 3+4 was seen in the left mid lateral (with perineural invasion), left base, and left mid.  He underwent staging scans with CT A/P and bone scan on 05/19/23.  The CT scan did not show any pathologic lymphadenopathy but did show a questionable sclerotic lesion in the superior medial right iliac crest.  On bone scan, there was multifocal uptake in the left humeral  head, right iliac bone and sternum, suspicious for osseous metastases.  The patient reviewed the biopsy and imaging results with his urologist and he has kindly been referred today for discussion of potential radiation treatment options.   PREVIOUS RADIATION THERAPY: No  PAST MEDICAL HISTORY:  Past Medical History:  Diagnosis Date   Arthritis    a little in back   Depression    situational-out of work 2 years   Hyperlipidemia    Hypertension    Pre-diabetes    Seasonal allergies       PAST SURGICAL HISTORY: Past Surgical History:  Procedure Laterality Date   LUMBAR SPINE SURGERY  10/2020   PROSTATE BIOPSY     RADIOLOGY WITH ANESTHESIA N/A 02/28/2020   Procedure: MRI WITH ANESTHESIA LUMBAR SPINE WITHOUT CONTRAST;  Surgeon: Radiologist, Medication, MD;  Location: [facility] OR;  Service: Radiology;  Laterality: N/A;   RADIOLOGY WITH ANESTHESIA N/A 04/25/2021   Procedure: MRI WITH LUMBER SPINE WITHOUT CONTRAST;  Surgeon: Radiologist, Medication, MD;  Location: [facility] OR;  Service: Radiology;  Laterality: N/A;    FAMILY HISTORY:  Family History  Problem Relation Age of Onset   Healthy Mother    Healthy Father     SOCIAL HISTORY:  Social History   Socioeconomic History   Marital status: Single    Spouse name: Not on file   Number of children: Not on file   Years of education: Not on file   Highest  education level: Not on file  Occupational History   Not on file  Tobacco Use   Smoking status: Former   Smokeless tobacco: Never   Tobacco comments:    Smoke  1 year in  my 20's   Vaping Use   Vaping status: Never Used  Substance and Sexual Activity   Alcohol use: Never   Drug use: Never   Sexual activity: Not Currently  Other Topics Concern   Not on file  Social History Narrative   Not on file   Social Drivers of Health   Financial Resource Strain: At Risk (09/09/2022)   Received from [employer]    Financial Resource Strain: 2  Food Insecurity:  Food Insecurity Present (05/22/2023)   Hunger Vital Sign    Worried About Running Out of Food in the Last Year: Never true    Ran Out of Food in the Last Year: Sometimes true  Transportation Needs: No Transportation Needs (05/22/2023)   PRAPARE - Administrator, Civil Service (Medical): No    Lack of Transportation (Non-Medical): No  Physical Activity: Not on File (07/11/2021)   Received from [facility]   Physical Activity    Physical Activity: 0  Stress: Not on File (07/11/2021)   Received from [facility]   Stress    Stress: 0  Social Connections: Not on File (11/27/2022)   Received from [facility]   Social Connections    Connectedness: 0  Intimate Partner Violence: Not At Risk (05/22/2023)   Humiliation, Afraid, Rape, and Kick questionnaire    Fear of Current or Ex-Partner: No    Emotionally Abused: No    Physically Abused: No    Sexually Abused: No    ALLERGIES: Gramineae pollens, Lisinopril, and Other  MEDICATIONS:  Current Outpatient Medications  Medication Sig Dispense Refill   fluticasone (FLONASE) 50 MCG/ACT nasal spray Place 2 sprays into both nostrils daily.     gabapentin (NEURONTIN) 400 MG capsule Take 1 capsule by mouth daily.     naloxone (NARCAN) nasal spray 4 mg/0.1 mL CALL 911. SPR CONTENTS OF ONE SPRAYER (0.1ML) INTO ONE NOSTRIL. REPEAT IN 2-3 MIN IF SYMPTOMS OF OPIOID EMERGENCY PERSIST, ALTERNATE NOSTRILS     acetaminophen (TYLENOL) 500 MG tablet Take 500-1,000 mg by mouth every 6 (six) hours as needed for moderate pain.     amLODipine (NORVASC) 10 MG tablet Take 1 tablet (10 mg total) by mouth daily. 30 tablet 2   diclofenac (VOLTAREN) 75 MG EC tablet 75 mg 2 (two) times daily as needed.     doxycycline (VIBRAMYCIN) 100 MG capsule Take 1 capsule (100 mg total) by mouth 2 (two) times daily. (Patient not taking: Reported on 04/22/2021) 14 capsule 0   DULoxetine (CYMBALTA) 30 MG capsule Take 30 mg by mouth daily.     loratadine (CLARITIN) 10 MG tablet Take 1 tablet (10  mg total) by mouth daily. (Patient taking differently: Take 10 mg by mouth at bedtime.) 30 tablet 2   losartan (COZAAR) 100 MG tablet Take 100 mg by mouth daily.     Misc. Devices (PULSE OXIMETER) MISC 1 each by Does not apply route in the morning and at bedtime. 1 each 0   Multiple Vitamins-Minerals (ONE-A-DAY MENS 50+) TABS Take 1 tablet by mouth daily.     omega-3 acid ethyl esters (LOVAZA) 1 g capsule Take 2 capsules by mouth 2 (two) times daily.     oxyCODONE-acetaminophen (PERCOCET) 10-325 MG tablet Take 1 tablet by  mouth every 6 (six) hours as needed.     rosuvastatin (CRESTOR) 10 MG tablet Take 10 mg by mouth daily.     tadalafil (CIALIS) 20 MG tablet Take 20 mg by mouth daily as needed.     tiZANidine (ZANAFLEX) 4 MG tablet Take 4 mg by mouth 3 (three) times daily as needed for muscle spasms.     No current facility-administered medications for this encounter.    REVIEW OF SYSTEMS:  On review of systems, the patient reports that he is doing well overall. He denies any chest pain, shortness of breath, cough, fevers, chills, night sweats, unintended weight changes. He denies any bowel disturbances, and denies abdominal pain, nausea or vomiting. He denies any new musculoskeletal or joint aches or pains. His IPSS was 10, indicating moderate urinary symptoms. His SHIM was 12, indicating he has moderate erectile dysfunction. A complete review of systems is obtained and is otherwise negative.    PHYSICAL EXAM:  Wt Readings from Last 3 Encounters:  05/22/23 241 lb 3.2 oz (109.4 kg)  04/25/21 260 lb (117.9 kg)  02/28/20 260 lb (117.9 kg)   Temp Readings from Last 3 Encounters:  05/22/23 97.6 F (36.4 C)  04/25/21 98 F (36.7 C)  09/10/20 99.1 F (37.3 C) (Oral)   BP Readings from Last 3 Encounters:  05/22/23 (!) 143/97  04/25/21 138/89  09/10/20 (!) 158/115   Pulse Readings from Last 3 Encounters:  05/22/23 83  04/25/21 82  09/10/20 94    /10  In general this is a well  appearing African-American man in no acute distress.  He's alert and oriented x4 and appropriate throughout the examination. Cardiopulmonary assessment is negative for acute distress and he exhibits normal effort.     KPS = 100  100 - Normal; no complaints; no evidence of disease. 90   - Able to carry on normal activity; minor signs or symptoms of disease. 80   - Normal activity with effort; some signs or symptoms of disease. 48   - Cares for self; unable to carry on normal activity or to do active work. 60   - Requires occasional assistance, but is able to care for most of his personal needs. 50   - Requires considerable assistance and frequent medical care. 40   - Disabled; requires special care and assistance. 30   - Severely disabled; hospital admission is indicated although death not imminent. 20   - Very sick; hospital admission necessary; active supportive treatment necessary. 10   - Moribund; fatal processes progressing rapidly. 0     - Dead  Karnofsky DA, Abelmann WH, Craver LS and Burchenal JH [phone]) The use of the nitrogen mustards in the palliative treatment of carcinoma: with particular reference to bronchogenic carcinoma Cancer 1 634-56  LABORATORY DATA:  Lab Results  Component Value Date   WBC 7.3 04/29/2020   HGB 15.9 04/29/2020   HCT 47.5 04/29/2020   MCV 71.1 (L) 04/29/2020   PLT 380 04/29/2020   Lab Results  Component Value Date   NA 141 04/25/2021   K 4.0 04/25/2021   CL 105 04/25/2021   CO2 26 04/25/2021   Lab Results  Component Value Date   ALT 44 07/28/2019   AST 35 07/28/2019   ALKPHOS 83 07/28/2019   BILITOT 0.5 07/28/2019     RADIOGRAPHY: CT ABDOMEN PELVIS W CONTRAST Result Date: 05/20/2023 CLINICAL DATA:  Prostate cancer.  Staging.  * Tracking Code: BO * EXAM: CT ABDOMEN AND PELVIS WITH  CONTRAST TECHNIQUE: Multidetector CT imaging of the abdomen and pelvis was performed using the standard protocol following bolus administration of intravenous  contrast. RADIATION DOSE REDUCTION: This exam was performed according to the departmental dose-optimization program which includes automated exposure control, adjustment of the mA and/or kV according to patient size and/or use of iterative reconstruction technique. CONTRAST:  OMNIPAQUE IOHEXOL 300 MG/ML  SOLN COMPARISON:  None Available. FINDINGS: Lower chest: There is some linear opacity lung bases likely scar or atelectasis. No pleural effusion. Hepatobiliary: Diffuse fatty liver infiltration. Patent portal vein. Gallbladder is nondilated. No space-occupying liver lesion. Pancreas: Unremarkable. No pancreatic ductal dilatation or surrounding inflammatory changes. Spleen: Normal in size without focal abnormality. Adrenals/Urinary Tract: Adrenal glands are preserved. There are bilateral Bosniak 1 renal cysts. Dominant focus on the left measures 3.8 cm in diameter with Hounsfield unit of close to 0. Several smaller foci identified in each kidney as well. There is 1 lesion along the upper pole left kidney which has a nodular somewhat thicker wall as seen on axial series 3, image 31 measuring 16 mm. The nodular area potential enhancement measures up to 5 mm. This would be a Bosniak 3 lesion. Possible malignant lesion. The ureters have normal course and caliber extending down to the urinary bladder. Contracted urinary bladder. Stomach/Bowel: Large bowel has a normal course and caliber with moderate stool. Colonic diverticula. Redundant course of the sigmoid colon into the mid abdomen. Stomach is underdistended. There is proximal wall thickening of up to 3.8 cm. The wall is thicker than expected even for this level of underdistention. Please correlate for any symptoms and gastric thickening. Small bowel is nondilated. There is rectus muscle diastasis with small umbilical hernia involving some loops of small bowel. No signs of obstruction. Vascular/Lymphatic: Normal caliber aorta and IVC. There is some  atherosclerotic partially calcified plaque along the aorta and iliac vessels. No specific abnormal lymph node enlargement identified in the abdomen and pelvis. Reproductive: Enlarged heterogeneous prostate. Please correlate for history of prostate neoplasm. There is some mild enlargement of the seminal vesicles symmetrically, nonspecific. Other: No free air or free fluid. Musculoskeletal: Scattered degenerative changes of the spine and pelvis. Endplate osteophytes along the spine with some Schmorl's node changes. Disc bulging. Some mild asymmetric sclerosis insertion along the superomedial right iliac crest. Please correlate with separate bone scan. Note is made of some bridging osteophytes along the sacroiliac joints. IMPRESSION: Heterogeneous prostate. Please correlate for location of neoplasm. Is also some fullness of the seminal vesicles. No developing abnormal lymph node enlargement identified in the abdomen and pelvis. Slight asymmetric sclerosis along the superomedial right iliac bone compared to left. Please correlate with separate bone scan dictation. Complex upper pole left-sided renal cystic lesion with a potential enhancing nodular component. Bosniak 3 lesion. Please correlate with any prior or additional evaluation. Fatty liver infiltration. Small umbilical hernia involving small bowel without obstruction. Mild fold thickening along the stomach. Please correlate with any symptoms Electronically Signed   By: Karen Kays M.D.   On: 05/20/2023 18:34   NM Bone Scan Whole Body Result Date: 05/20/2023 CLINICAL DATA:  Prostate cancer.  Known arthritis EXAM: NUCLEAR MEDICINE WHOLE BODY BONE SCAN TECHNIQUE: Whole body anterior and posterior images were obtained approximately 3 hours after intravenous injection of radiopharmaceutical. RADIOPHARMACEUTICALS:  21.6 mCi Technetium-25m MDP IV COMPARISON:  Separate CT scan abdomen pelvis 05/19/2023. FINDINGS: Physiologic tracer seen along the kidneys and bladder.  There are areas of degenerative type uptake seen along the knees, ankles. There  is also uptake along the right sternoclavicular joint. However there is focal uptake along the the left iliac bone medial and superior. In addition there is amorphous uptake along left humeral head. These areas are worrisome for potential osseous metastatic disease. Uptake along the lower cervical spine is indeterminate. Please correlate with x-rays of the left shoulder, sternum and cervical spine. IMPRESSION: Multifocal areas of uptake seen suggestive of osseous metastatic disease including the left humeral head, right superomedial iliac bone, sternum. Areas of more degenerative type uptake elsewhere. Focus along the lower cervical spine is indeterminate. Please correlate with known history or cervical spine two-view x-ray. Electronically Signed   By: Karen Kays M.D.   On: 05/20/2023 17:54      IMPRESSION/PLAN:  1. 63 y.o. gentleman with Stage T1c adenocarcinoma of the prostate with Gleason Score of 4+3, and PSA of 9.21. We discussed the patient's workup and outlined the nature of prostate cancer in this setting. The patient's T stage, Gleason's score, and PSA put him into the unfavorable intermediate risk group but his disease staging imaging to date suggest the possibility of oligometastatic disease.  We have recommended PSMA PET imaging to confirm the extent of metastatic disease which will guide our treatment recommendations.  Pending there are no findings to suggest diffuse metastatic disease, he is eligible for a variety of potential treatment options including prostatectomy or ADT in combination with either 5.5-8 weeks of external radiation +/- SBRT to osseous metastases or a brachytherapy seed boost procedure followed by 5 weeks of daily external beam radiation +/- SBRT to the sites of osseous metastases. If the PSMA PET confirms localized unfavorable intermediate risk prostate cancer only, we could consider ST-ADT  concurrent with 5.5 weeks of external beam radiation or brachytherapy alone. We discussed the available radiation techniques, and focused on the details and logistics and delivery. We discussed and outlined the risks, benefits, short and long-term effects associated with radiotherapy and compared and contrasted these with prostatectomy. We discussed the role of SpaceOAR in reducing the rectal toxicity associated with radiotherapy. He appears to have a good understanding of his disease and our treatment recommendations which are of curative intent.  He was encouraged to ask questions that were answered to his stated satisfaction.  At the end of the conversation the patient is interested in moving forward with PSMA PET imaging to confirm his disease stage and.inform our treatment recommendations.  We will share our discussion with Dr. Jennette Bill and will plan to meet back with the patient shortly after his PSMA PET scan to review the results and formalize a treatment plan at that time.  We enjoyed meeting him today and look forward to continuing to participate in his care.       Marguarite Arbour, [name]    Margaretmary Dys, MD  [facility]  Radiation Oncology Direct Dial: [phone]  Fax: [phone] [email or web address]  Skype  LinkedIn   This document serves as a record of services personally performed by Margaretmary Dys, MD and Marcello Fennel, [name]. It was created on their behalf by Mickie Bail, a trained medical scribe. The creation of this record is based on the scribe's personal observations and the provider's statements to them. This document has been checked and approved by the attending provider.

## 2023-05-22 ENCOUNTER — Encounter: Payer: Self-pay | Admitting: Radiation Oncology

## 2023-05-22 ENCOUNTER — Ambulatory Visit
Admission: RE | Admit: 2023-05-22 | Discharge: 2023-05-22 | Disposition: A | Discharge status: Home / Self Care | Payer: Medicaid Other | Source: Ambulatory Visit | Attending: Radiation Oncology | Admitting: Radiation Oncology

## 2023-05-22 VITALS — BP 143/97 | HR 83 | Temp 97.6°F | Resp 18 | Ht 70.0 in | Wt 241.2 lb

## 2023-05-22 DIAGNOSIS — C61 Malignant neoplasm of prostate: Secondary | ICD-10-CM | POA: Diagnosis present

## 2023-05-22 DIAGNOSIS — I700 Atherosclerosis of aorta: Secondary | ICD-10-CM | POA: Insufficient documentation

## 2023-05-22 DIAGNOSIS — M129 Arthropathy, unspecified: Secondary | ICD-10-CM | POA: Diagnosis not present

## 2023-05-22 DIAGNOSIS — I10 Essential (primary) hypertension: Secondary | ICD-10-CM | POA: Diagnosis not present

## 2023-05-22 DIAGNOSIS — K429 Umbilical hernia without obstruction or gangrene: Secondary | ICD-10-CM | POA: Diagnosis not present

## 2023-05-22 DIAGNOSIS — Z79899 Other long term (current) drug therapy: Secondary | ICD-10-CM | POA: Insufficient documentation

## 2023-05-22 DIAGNOSIS — E785 Hyperlipidemia, unspecified: Secondary | ICD-10-CM | POA: Diagnosis not present

## 2023-05-22 DIAGNOSIS — N281 Cyst of kidney, acquired: Secondary | ICD-10-CM | POA: Diagnosis not present

## 2023-05-22 DIAGNOSIS — K5730 Diverticulosis of large intestine without perforation or abscess without bleeding: Secondary | ICD-10-CM | POA: Insufficient documentation

## 2023-05-22 DIAGNOSIS — K760 Fatty (change of) liver, not elsewhere classified: Secondary | ICD-10-CM | POA: Insufficient documentation

## 2023-05-22 NOTE — Progress Notes (Signed)
 Introduced myself to the patient as the prostate nurse navigator.  He is here to discuss his radiation treatment options and will proceed with a PSMA PET scan prior to finalizing recommendations.  Patient aware that I will work to ensure PSMA PET is scheduled and contact with next steps.

## 2023-05-28 ENCOUNTER — Encounter (HOSPITAL_COMMUNITY)
Admission: RE | Admit: 2023-05-28 | Discharge: 2023-05-28 | Disposition: A | Discharge status: Home / Self Care | Payer: Medicaid Other | Source: Ambulatory Visit | Attending: Urology | Admitting: Urology

## 2023-05-28 DIAGNOSIS — C61 Malignant neoplasm of prostate: Secondary | ICD-10-CM | POA: Insufficient documentation

## 2023-05-28 MED ORDER — FLOTUFOLASTAT F 18 GALLIUM 296-5846 MBQ/ML IV SOLN
8.0000 | Freq: Once | INTRAVENOUS | Status: AC
  Administered 2023-05-28: 8.24 via INTRAVENOUS
  Filled 2023-05-28: qty 8

## 2023-05-29 ENCOUNTER — Ambulatory Visit
Admission: RE | Admit: 2023-05-29 | Discharge: 2023-05-29 | Disposition: A | Discharge status: Home / Self Care | Payer: Self-pay | Source: Ambulatory Visit | Attending: Radiation Oncology | Admitting: Radiation Oncology

## 2023-05-29 ENCOUNTER — Encounter: Payer: Self-pay | Admitting: Radiation Oncology

## 2023-05-29 DIAGNOSIS — C61 Malignant neoplasm of prostate: Secondary | ICD-10-CM

## 2023-05-29 NOTE — Progress Notes (Signed)
 Telephone nursing appointment for review of most recent PET SCAN. I verified patient's identity x2 and began nursing interview.   Patient reports polyuria. Patient denies any other related issues at this time.   Meaningful use complete.   Patient aware of their 3pm 37/25 telephone appointment w/ Ashlyn Bruning [name]. I left my extension [phone] in case patient needs anything. Patient verbalized understanding. This concludes the nursing interview.   Patient contact [phone]     Ruel Favors, LPN

## 2023-05-29 NOTE — Progress Notes (Signed)
 STAT read request placed for PSMA PET for upcoming consult.

## 2023-05-29 NOTE — Progress Notes (Signed)
 I called and spoke with the patient to review the results of his recent PSMA PET scan from 05/28/2023.  Fortunately, there is no evidence of disease outside of the prostate.  There was a focus of very mild radiotracer uptake within the central aspect of the L3 vertebral body with a well-circumscribed sclerotic lesion at this site on the CT portion of the exam.  Comparison MRIs from December 2021 and February 2023 described the same lesion as a lipid poor hemangioma and there has been no change in size from the MRI in 2021 so findings remain consistent with a benign bone lesion.  He had a follow-up visit with Dr. Jennette Bill this morning and got a 12-month Eligard injection.  Now that we have confirmed that he has localized prostate cancer only, we discussed the recommendation for ST-ADT concurrent with either brachytherapy or 5-1/2 weeks of daily external beam radiation.  We discussed these options again today over the telephone and the patient is most interested in proceeding with the 5.5 week course of daily external beam radiation so we will share our discussion with Dr. Jennette Bill and coordinate for fiducial markers and SpaceOAR gel placement in late April or early May 2025, in anticipation of beginning his daily radiation treatments in May 2025, approximately 2 months from the start of ADT.  He appears to have a good understanding of his disease and our treatment recommendations which are of curative intent.  He is comfortable and in agreement with the stated plan so we will proceed with treatment planning accordingly and look forward to continuing to participate in his care.  He knows that he is welcome to call at anytime in the interim with any further questions or concerns.  Marguarite Arbour, MMS, [name] [facility] Radiation Oncology Physician Assistant Direct Dial: [phone]  Fax: [phone]

## 2023-06-02 NOTE — Progress Notes (Signed)
 Patient received ADT, Eligard, on 3/7.  Plan of care in progress.

## 2023-06-03 ENCOUNTER — Other Ambulatory Visit: Payer: Self-pay | Admitting: Urology

## 2023-07-02 NOTE — Progress Notes (Signed)
 RN left message for call back to review next steps for fiducial's, spaceOAR, and CT Simulation.

## 2023-07-03 DIAGNOSIS — C61 Malignant neoplasm of prostate: Secondary | ICD-10-CM

## 2023-07-03 NOTE — Progress Notes (Signed)
 RN spoke with patient to review next steps with fiducial's, spaceOAR, and CT Simulation.  All questions answered.  Patient voiced concerns with disability, and finances related to not being able to work due to injury back in 2021.  RN placed referral to LCSW for review of any resources that we can offer.  RN encouraged patient to call with any questions or concerns that may develop.  Plan of care in progress.

## 2023-07-06 ENCOUNTER — Inpatient Hospital Stay: Attending: Radiation Oncology | Admitting: Licensed Clinical Social Worker

## 2023-07-06 DIAGNOSIS — C61 Malignant neoplasm of prostate: Secondary | ICD-10-CM

## 2023-07-06 NOTE — Progress Notes (Signed)
 [facility] Clinical Social Work  Initial Assessment   Julian [name] is a 63 y.o. year old male contacted by phone. Clinical Social Work was referred by medical provider for assessment of psychosocial needs.   SDOH (Social Determinants of Health) assessments performed: Yes   SDOH Screenings   Food Insecurity: Food Insecurity Present (05/22/2023)  Housing: Low Risk  (05/29/2023)  Transportation Needs: No Transportation Needs (05/29/2023)  Utilities: Not At Risk (05/29/2023)  Alcohol Screen: Low Risk  (05/22/2023)  Depression (PHQ2-9): Medium Risk (05/22/2023)  Financial Resource Strain: At Risk (09/09/2022)   Received from [facility]  Physical Activity: Not on File (07/11/2021)   Received from [facility]  Social Connections: Not on File (11/27/2022)   Received from [facility]  Stress: Not on File (07/11/2021)   Received from [facility]  Tobacco Use: Medium Risk (05/29/2023)     Distress Screen completed: Yes    05/29/2023    9:04 AM  ONCBCN DISTRESS SCREENING  Distress experienced in past week (1-10) 3  Emotional problem type Adjusting to illness;Nervousness/Anxiety      Family/Social Information:  Housing Arrangement: patient is unhoused / unstably housed pt reports he is able to stay on a friend's couch most nights, but does not have his own place at this time.  Pt is not interested in applying for section 8 housing at this time.  Family members/support persons in your life? Pt moved here from [place] in 2021 which is where his extended family resides.  Pt has very limited support.   Transportation concerns: no, pt reports he does have a car  Employment: Unemployed pt worked for [employer] in [place] and hurt his back.  Pt reports he has been trying to get disability since that time, but has been denied.  Pt reports he does not have enough work credits for social security disability and his most recent application for SSI is pending.    Income source: No income Financial concerns: Yes, current concerns Type of concern: Dealer access concerns: yes Religious or spiritual practice: Yes-[employer] Advanced directives: Not known Services Currently in place:  none  Coping/ Adjustment to diagnosis: Patient understands treatment plan and what happens next? yes Concerns about diagnosis and/or treatment: Overwhelmed by information and Quality of life Patient reported stressors: Finances and Adjusting to my illness Hopes and/or priorities: pt's priority is to start treatment w/ the hope of positive results Patient enjoys  not addressed Current coping skills/ strengths: Capable of independent living , Motivation for treatment/growth , and Physical Health     SUMMARY: Current SDOH Barriers:  Financial constraints related to unemployment  Clinical Social Work Clinical Goal(s):  Explore community resource options for unmet needs related to:  Financial Strain   Interventions: Discussed common feeling and emotions when being diagnosed with cancer, and the importance of support during treatment Informed patient of the support team roles and support services at [facility] Provided CSW contact information and encouraged patient to call with any questions or concerns Provided patient with information about the [employer] and [employer].  Covering CSW provided pt w/ assigned CSW's contact information to pursue financial resources when treatment starts mid May.   Follow Up Plan: Patient will contact CSW with any support or resource needs Patient verbalizes understanding of plan: Yes    Rachel Moulds, LCSW Clinical Social Worker [facility]  Patient is participating in a Managed Medicaid Plan:  Yes

## 2023-07-08 ENCOUNTER — Other Ambulatory Visit: Payer: Self-pay | Admitting: Urology

## 2023-07-08 DIAGNOSIS — C61 Malignant neoplasm of prostate: Secondary | ICD-10-CM

## 2023-07-14 ENCOUNTER — Inpatient Hospital Stay

## 2023-07-14 NOTE — Progress Notes (Signed)
 [facility] CSW Progress Note   Patient completed initial assessment on 4/14. Clinical Child psychotherapist (CSW) followed up with patient on this date. Patient confirmed information from initial assessment. Patient begins treatment in May. CSW discussed resources mentioned during initial assessment (Housing Programs through [employer], Animal nutritionist, Licensed conveyancer). CSW informed patient of active treatment eligbility for [employer]. CSW informed patient of [employer] eligibility and how to access it. CSW CSW sent email with information for patient to reference (verbal authorization given, and email confirmed). Patient encouraged to contact CSW once active treatment begins for continued assistance or earlier if needed. Direct contact given. Patient verbalized understanding of plan.    Maudie Sorrow, LCSW Clinical Social Worker [facility]

## 2023-07-30 ENCOUNTER — Encounter (HOSPITAL_COMMUNITY): Payer: Self-pay | Admitting: Urology

## 2023-07-30 NOTE — Progress Notes (Addendum)
 Spoke w/ via phone for pre-op interview--- Julian [name] needs dos----  A1C, CBG, BMP and EKG per anesthesia       [name]------ COVID test -----patient states asymptomatic no test needed Arrive at -------0530 NPO after MN NO Solid Food.   Pre-Surgery Ensure or G2:  Med rec completed Medications to take morning of surgery -----Norvasc  and Oxycodone PRN Diabetic medication -----  GLP1 agonist last dose: GLP1 instructions:  Patient instructed no nail polish to be worn day of surgery Patient instructed to bring photo id and insurance card day of surgery Patient aware to have Driver (ride ) / caregiver    for 24 hours after surgery - Friend Julian [name] Patient Special Instructions ----- Shower with antibacterial soap. Fleet enema AM before surgery.  Pre-Op special Instructions -----  Patient verbalized understanding of instructions that were given at this phone interview. Patient denies chest pain, sob, fever, cough at the interview.

## 2023-08-06 NOTE — Anesthesia Preprocedure Evaluation (Signed)
 Anesthesia Evaluation  Patient identified by MRN, date of birth, ID band Patient awake    Reviewed: Allergy & Precautions, H&P , NPO status , Patient's Chart, lab work & pertinent test results  Airway Mallampati: II  TM Distance: >3 FB Neck ROM: full    Dental  (+) Dental Advisory Given, Missing   Pulmonary former smoker   breath sounds clear to auscultation       Cardiovascular hypertension, Pt. on medications  Rhythm:regular Rate:Normal     Neuro/Psych  PSYCHIATRIC DISORDERS  Depression       GI/Hepatic   Endo/Other  diabetes    Renal/GU      Musculoskeletal  (+) Arthritis ,    Abdominal   Peds  Hematology   Anesthesia Other Findings   Reproductive/Obstetrics                              Anesthesia Physical Anesthesia Plan  ASA: 2  Anesthesia Plan: MAC   Post-op Pain Management: Tylenol PO (pre-op)*   Induction: Intravenous  PONV Risk Score and Plan: 2 and Ondansetron , Dexamethasone , Midazolam  and Treatment may vary due to age or medical condition  Airway Management Planned: Simple Face Mask  Additional Equipment:   Intra-op Plan:   Post-operative Plan: Extubation in OR  Informed Consent: I have reviewed the patients History and Physical, chart, labs and discussed the procedure including the risks, benefits and alternatives for the proposed anesthesia with the patient or authorized representative who has indicated his/her understanding and acceptance.     Dental advisory given  Plan Discussed with: CRNA  Anesthesia Plan Comments: (PAT note written 04/23/2021 by Allison Zelenak, [name]. )         Anesthesia Quick Evaluation

## 2023-08-07 ENCOUNTER — Ambulatory Visit (HOSPITAL_COMMUNITY)
Admission: RE | Admit: 2023-08-07 | Discharge: 2023-08-07 | Disposition: A | Discharge status: Home / Self Care | Attending: Urology | Admitting: Urology

## 2023-08-07 ENCOUNTER — Encounter (HOSPITAL_COMMUNITY): Payer: Self-pay | Admitting: Urology

## 2023-08-07 ENCOUNTER — Ambulatory Visit (HOSPITAL_BASED_OUTPATIENT_CLINIC_OR_DEPARTMENT_OTHER): Payer: Self-pay

## 2023-08-07 ENCOUNTER — Telehealth: Payer: Self-pay | Admitting: *Deleted

## 2023-08-07 ENCOUNTER — Ambulatory Visit (HOSPITAL_COMMUNITY): Payer: Self-pay

## 2023-08-07 ENCOUNTER — Encounter (HOSPITAL_COMMUNITY)
Admission: RE | Disposition: A | Discharge status: Home / Self Care | Payer: Self-pay | Source: Home / Self Care | Attending: Urology

## 2023-08-07 DIAGNOSIS — C61 Malignant neoplasm of prostate: Secondary | ICD-10-CM | POA: Insufficient documentation

## 2023-08-07 DIAGNOSIS — E119 Type 2 diabetes mellitus without complications: Secondary | ICD-10-CM | POA: Insufficient documentation

## 2023-08-07 DIAGNOSIS — Z79899 Other long term (current) drug therapy: Secondary | ICD-10-CM | POA: Diagnosis not present

## 2023-08-07 DIAGNOSIS — I10 Essential (primary) hypertension: Secondary | ICD-10-CM | POA: Diagnosis not present

## 2023-08-07 DIAGNOSIS — Z87891 Personal history of nicotine dependence: Secondary | ICD-10-CM | POA: Diagnosis not present

## 2023-08-07 HISTORY — PX: GOLD SEED IMPLANT: SHX6343

## 2023-08-07 HISTORY — DX: Malignant (primary) neoplasm, unspecified: C80.1

## 2023-08-07 HISTORY — PX: SPACE OAR INSTILLATION: SHX6769

## 2023-08-07 HISTORY — DX: Type 2 diabetes mellitus without complications: E11.9

## 2023-08-07 LAB — BASIC METABOLIC PANEL WITH GFR
Anion gap: 10 (ref 5–15)
BUN: 23 mg/dL (ref 8–23)
CO2: 22 mmol/L (ref 22–32)
Calcium: 9.8 mg/dL (ref 8.9–10.3)
Chloride: 108 mmol/L (ref 98–111)
Creatinine, Ser: 1.63 mg/dL — ABNORMAL HIGH (ref 0.61–1.24)
GFR, Estimated: 48 mL/min — ABNORMAL LOW (ref >60)
Glucose, Bld: 125 mg/dL — ABNORMAL HIGH (ref 70–99)
Potassium: 3.6 mmol/L (ref 3.5–5.1)
Sodium: 140 mmol/L (ref 135–145)

## 2023-08-07 LAB — HEMOGLOBIN A1C
Hgb A1c MFr Bld: 6.8 % — ABNORMAL HIGH (ref 4.8–5.6)
Mean Plasma Glucose: 148.46 mg/dL

## 2023-08-07 LAB — GLUCOSE, CAPILLARY
Glucose-Capillary: 132 mg/dL — ABNORMAL HIGH (ref 70–99)
Glucose-Capillary: 140 mg/dL — ABNORMAL HIGH (ref 70–99)

## 2023-08-07 SURGERY — INSERTION, GOLD SEEDS
Anesthesia: Monitor Anesthesia Care | Site: Prostate

## 2023-08-07 MED ORDER — SODIUM CHLORIDE (PF) 0.9 % IJ SOLN
INTRAMUSCULAR | Status: AC
  Filled 2023-08-07: qty 20

## 2023-08-07 MED ORDER — ORAL CARE MOUTH RINSE: 15.0000 mL | Freq: Once | OROMUCOSAL | Status: AC

## 2023-08-07 MED ORDER — DEXAMETHASONE SODIUM PHOSPHATE 10 MG/ML IJ SOLN
INTRAMUSCULAR | Status: DC | PRN
  Administered 2023-08-07: 5 mg via INTRAVENOUS

## 2023-08-07 MED ORDER — LIDOCAINE HCL 2 % IJ SOLN
INTRAMUSCULAR | Status: AC
  Filled 2023-08-07: qty 20

## 2023-08-07 MED ORDER — LIDOCAINE HCL 2 % IJ SOLN
INTRAMUSCULAR | Status: DC | PRN
  Administered 2023-08-07: 10 mL

## 2023-08-07 MED ORDER — CHLORHEXIDINE GLUCONATE 0.12 % MT SOLN
15.0000 mL | Freq: Once | OROMUCOSAL | Status: AC
  Administered 2023-08-07: 15 mL via OROMUCOSAL

## 2023-08-07 MED ORDER — INSULIN ASPART 100 UNIT/ML IJ SOLN: 0.0000 [IU] | INTRAMUSCULAR | Status: DC | PRN

## 2023-08-07 MED ORDER — FENTANYL CITRATE (PF) 100 MCG/2ML IJ SOLN: 25.0000 ug | INTRAMUSCULAR | Status: DC | PRN

## 2023-08-07 MED ORDER — MIDAZOLAM HCL 2 MG/2ML IJ SOLN
INTRAMUSCULAR | Status: DC | PRN
  Administered 2023-08-07: 2 mg via INTRAVENOUS

## 2023-08-07 MED ORDER — LIDOCAINE 2% (20 MG/ML) 5 ML SYRINGE
INTRAMUSCULAR | Status: AC
  Filled 2023-08-07: qty 5

## 2023-08-07 MED ORDER — MIDAZOLAM HCL 2 MG/2ML IJ SOLN
INTRAMUSCULAR | Status: AC
  Filled 2023-08-07: qty 2

## 2023-08-07 MED ORDER — ACETAMINOPHEN 500 MG PO TABS
ORAL_TABLET | ORAL | Status: DC
Start: 2023-08-07 — End: 2023-08-07
  Filled 2023-08-07: qty 2

## 2023-08-07 MED ORDER — ONDANSETRON HCL 4 MG/2ML IJ SOLN
INTRAMUSCULAR | Status: DC | PRN
  Administered 2023-08-07: 4 mg via INTRAVENOUS

## 2023-08-07 MED ORDER — FENTANYL CITRATE (PF) 100 MCG/2ML IJ SOLN
INTRAMUSCULAR | Status: DC | PRN
Start: 2023-08-07 — End: 2023-08-07
  Administered 2023-08-07: 50 ug via INTRAVENOUS

## 2023-08-07 MED ORDER — FLEET ENEMA RE ENEM
1.0000 | ENEMA | Freq: Once | RECTAL | Status: DC
  Filled 2023-08-07: qty 1

## 2023-08-07 MED ORDER — DEXAMETHASONE SODIUM PHOSPHATE 10 MG/ML IJ SOLN
INTRAMUSCULAR | Status: AC
  Filled 2023-08-07: qty 1

## 2023-08-07 MED ORDER — PROPOFOL 10 MG/ML IV BOLUS
INTRAVENOUS | Status: DC | PRN
  Administered 2023-08-07: 100 mg via INTRAVENOUS
  Administered 2023-08-07: 100 ug/kg/min via INTRAVENOUS

## 2023-08-07 MED ORDER — ONDANSETRON HCL 4 MG/2ML IJ SOLN
INTRAMUSCULAR | Status: AC
  Filled 2023-08-07: qty 2

## 2023-08-07 MED ORDER — DROPERIDOL 2.5 MG/ML IJ SOLN: 0.6250 mg | Freq: Once | INTRAMUSCULAR | Status: DC | PRN

## 2023-08-07 MED ORDER — LIDOCAINE 2% (20 MG/ML) 5 ML SYRINGE
INTRAMUSCULAR | Status: DC | PRN
  Administered 2023-08-07: 80 mg via INTRAVENOUS

## 2023-08-07 MED ORDER — CHLORHEXIDINE GLUCONATE 0.12 % MT SOLN
OROMUCOSAL | Status: AC
  Filled 2023-08-07: qty 15

## 2023-08-07 MED ORDER — FENTANYL CITRATE (PF) 100 MCG/2ML IJ SOLN
INTRAMUSCULAR | Status: AC
  Filled 2023-08-07: qty 2

## 2023-08-07 MED ORDER — ACETAMINOPHEN 500 MG PO TABS
1000.0000 mg | ORAL_TABLET | Freq: Once | ORAL | Status: AC
  Administered 2023-08-07: 1000 mg via ORAL

## 2023-08-07 MED ORDER — CEFAZOLIN SODIUM-DEXTROSE 2-4 GM/100ML-% IV SOLN
2.0000 g | INTRAVENOUS | Status: AC
  Administered 2023-08-07: 2 g via INTRAVENOUS

## 2023-08-07 MED ORDER — SODIUM CHLORIDE (PF) 0.9 % IJ SOLN
INTRAMUSCULAR | Status: AC
  Filled 2023-08-07: qty 10

## 2023-08-07 MED ORDER — SODIUM CHLORIDE (PF) 0.9 % IJ SOLN
INTRAMUSCULAR | Status: DC | PRN
  Administered 2023-08-07: 10 mL

## 2023-08-07 MED ORDER — LACTATED RINGERS IV SOLN: INTRAVENOUS | Status: DC

## 2023-08-07 MED ORDER — CEFAZOLIN SODIUM-DEXTROSE 2-4 GM/100ML-% IV SOLN
INTRAVENOUS | Status: AC
  Filled 2023-08-07: qty 100

## 2023-08-07 SURGICAL SUPPLY — 23 items
BLADE CLIPPER SENSICLIP SURGIC (BLADE) ×1 IMPLANT
CNTNR URN SCR LID CUP LEK RST (MISCELLANEOUS) ×1 IMPLANT
COVER BACK TABLE 60X90IN (DRAPES) ×1 IMPLANT
DRSG TEGADERM 4X4.75 (GAUZE/BANDAGES/DRESSINGS) IMPLANT
DRSG TEGADERM 8X12 (GAUZE/BANDAGES/DRESSINGS) ×1 IMPLANT
GAUZE SPONGE 4X4 12PLY STRL (GAUZE/BANDAGES/DRESSINGS) IMPLANT
GLOVE BIO SURGEON STRL SZ7.5 (GLOVE) ×1 IMPLANT
GLOVE BIOGEL PI IND STRL 6.5 (GLOVE) IMPLANT
GLOVE SURG ORTHO 8.5 STRL (GLOVE) ×1 IMPLANT
GLOVE SURG SS PI 6.5 STRL IVOR (GLOVE) IMPLANT
IMPL SPACEOAR SYSTEM 10ML (Spacer) ×1 IMPLANT
MARKER GOLD PRELOAD 1.2X3 (Urological Implant) ×1 IMPLANT
MARKER SKIN DUAL TIP RULER LAB (MISCELLANEOUS) ×1 IMPLANT
NDL SPNL 22GX3.5 QUINCKE BK (NEEDLE) ×1 IMPLANT
NEEDLE SPNL 22GX3.5 QUINCKE BK (NEEDLE) ×1 IMPLANT
SHEATH ULTRASOUND LF (SHEATH) IMPLANT
SHEATH ULTRASOUND LTX NONSTRL (SHEATH) IMPLANT
SLEEVE SCD COMPRESS KNEE MED (STOCKING) ×1 IMPLANT
SURGILUBE 2OZ TUBE FLIPTOP (MISCELLANEOUS) ×1 IMPLANT
SYR 10ML LL (SYRINGE) IMPLANT
SYR CONTROL 10ML LL (SYRINGE) ×1 IMPLANT
TOWEL OR 17X24 6PK STRL BLUE (TOWEL DISPOSABLE) ×1 IMPLANT
UNDERPAD 30X36 HEAVY ABSORB (UNDERPADS AND DIAPERS) ×1 IMPLANT

## 2023-08-07 NOTE — Anesthesia Procedure Notes (Signed)
 Date/Time: 08/07/2023 7:49 AM  Performed by: Trenton Frock, CRNAAirway Equipment and Method: Oral airway Dental Injury: Teeth and Oropharynx as per pre-operative assessment

## 2023-08-07 NOTE — Transfer of Care (Signed)
 Immediate Anesthesia Transfer of Care Note  Patient: Julian [name]  Procedure(s) Performed: INSERTION, GOLD SEEDS (Prostate) INJECTION, HYDROGEL SPACER (Prostate)  Patient Location: PACU  Anesthesia Type:MAC  Level of Consciousness: awake, oriented, and patient cooperative  Airway & Oxygen Therapy: Patient Spontanous Breathing and Patient connected to face mask oxygen  Post-op Assessment: Report given to RN, Post -op Vital signs reviewed and stable, Patient moving all extremities X 4, and Patient able to stick tongue midline  Post vital signs: Reviewed and stable  Last Vitals:  Vitals Value Taken Time  BP 118/81 08/07/23 0803  Temp 36.4 C 08/07/23 0804  Pulse 75 08/07/23 0806  Resp 15 08/07/23 0806  SpO2 100 % 08/07/23 0806  Vitals shown include unfiled device data.  Last Pain:  Vitals:   08/07/23 0617  TempSrc: Oral  PainSc: 7       Patients Stated Pain Goal: 5 (08/07/23 0617)  Complications: No notable events documented.

## 2023-08-07 NOTE — Op Note (Signed)
 [name], [name] MEDICAL RECORD NO: [id number] ACCOUNT NO: [id number] DATE OF BIRTH: Nov 02, 1960 FACILITY: [facility] LOCATION: [facility] PHYSICIAN: Osborn Blaze, MD  Operative Report   DATE OF PROCEDURE: 08/07/2023  PREOPERATIVE DIAGNOSIS:  Moderate risk for prostate cancer.  PROCEDURE PERFORMED: 1. Prostate fiducial marker placement. 2. SpaceOAR placement with ultrasound guidance.  ESTIMATED BLOOD LOSS:  Nil.  COMPLICATIONS:  None.  SPECIMENS:  None.  FINDINGS: 1.  Unremarkable prostate on ultrasound. 2.  Fiducial marker placement in the right mid base, right mid apex, and left mid lateral aspects. 3.  Excellent posterior displacement of the anterior rectal wall with SpaceOAR gel matrix placement.  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  This is clinically localized on PET imaging.  Options were discussed for management and  he is on a path of curative intent with short-term androgen deprivation and radiation as part of this path.  The radiation oncology team has requested prostate fiducial marker placement and SpaceOAR.  He presents for this today.  Informed consent was  obtained and placed in medical record.  DESCRIPTION OF PROCEDURE:  The patient was identified and verified and procedure being prostate fiducial marker and SpaceOAR placement was confirmed.  Procedure timeout was performed.  Intravenous antibiotic administered.  Placed into a medium lithotomy  position after monitored anesthesia care sedation was introduced.  His penis and scrotum were tucked out to the side using large Tegaderm.  Sterile field was created by prepping his perineum with iodine.  Transrectal ultrasound probe was introduced.   Biplanar transrectal ultrasound revealed unremarkable prostate and seminal vesicles.  No evidence of extraprostatic extension.  Under ultrasound guidance,  fiducial markers were placed in transperineal approach as per above.  The probe was then taken off  anterior tension and the SpaceOAR needle was introduced transperineally into the anterior perirectal space as verified by 2 mL of hydrodissection with normal saline.  Next, the SpaceOAR gel matrix was placed, 10 mL in volume, according to manufacturer's  guidelines across 10 seconds.  This resulted in excellent posterior displacement of the anterior rectal wall.  Procedure was then terminated.  The patient tolerated the procedure well and no immediate periprocedural complications.  The patient was taken  to the anesthesia care unit in stable condition.  Plan for discharge home.   PUS D: 08/07/2023 8:00:41 am T: 08/07/2023 11:37:00 am  JOB: [id number]/ [id number]

## 2023-08-07 NOTE — Brief Op Note (Signed)
 08/07/2023  7:56 AM  PATIENT:  Barron Lien  63 y.o. male  PRE-OPERATIVE DIAGNOSIS:  PROSTATE CANCER  POST-OPERATIVE DIAGNOSIS:  PROSTATE CANCER  PROCEDURE:  Procedure(s): INSERTION, GOLD SEEDS (N/A) INJECTION, HYDROGEL SPACER (N/A)  SURGEON:  Surgeons and Role:    * Manny, Harvey Linen., MD - Primary  PHYSICIAN ASSISTANT:   ASSISTANTS: none   ANESTHESIA:   local and MAC  EBL:  minimal   BLOOD ADMINISTERED:none  DRAINS: none   LOCAL MEDICATIONS USED:  LIDOCAINE    SPECIMEN:  No Specimen  DISPOSITION OF SPECIMEN:  N/A  COUNTS:  YES  TOURNIQUET:  * No tourniquets in log *  DICTATION: .Other Dictation: Dictation Number [id number]  PLAN OF CARE: Discharge to home after PACU  PATIENT DISPOSITION:  PACU - hemodynamically stable.   Delay start of Pharmacological VTE agent (>24hrs) due to surgical blood loss or risk of bleeding: yes

## 2023-08-07 NOTE — Telephone Encounter (Signed)
 CALLED PATIENT TO REMIND OF SIM APPT. FOR 08-10-23- ARRIVAL TIME- 10:45 AM @ [facility], INFORMED PATIENT TO ARRIVE WITH A FULL BLADDER, SPOKE WITH PATIENT AND HE IS AWARE OF THIS APPT.

## 2023-08-07 NOTE — H&P (Signed)
 Julian [name] is an 63 y.o. male.    Chief Complaint: Pre-OP Prostate Fiducial Marker / SPACE-OAR Placement  HPI:   1- Moderate Risk Prosatate Cancer - grade 3 cancer on BX 2025 on eval PSA 9.2. PET localized. 31gm, no median. Elects primary radiation + 6mos androgen deprivaiotn. Eliagrd 45 given 05/2023.  Today "Julian [name]" is seen to proceed with prostate fiducial marker and space-oar placement.     Past Medical History:  Diagnosis Date   Arthritis    a little in back   Cancer [facility])    Depression    situational-out of work 2 years   Diabetes mellitus without complication (HCC)    Hyperlipidemia    Hypertension    Seasonal allergies     Past Surgical History:  Procedure Laterality Date   LUMBAR SPINE SURGERY  10/2020   PROSTATE BIOPSY     RADIOLOGY WITH ANESTHESIA N/A 02/28/2020   Procedure: MRI WITH ANESTHESIA LUMBAR SPINE WITHOUT CONTRAST;  Surgeon: Radiologist, Medication, MD;  Location: [facility] OR;  Service: Radiology;  Laterality: N/A;   RADIOLOGY WITH ANESTHESIA N/A 04/25/2021   Procedure: MRI WITH LUMBER SPINE WITHOUT CONTRAST;  Surgeon: Radiologist, Medication, MD;  Location: [facility] OR;  Service: Radiology;  Laterality: N/A;    Family History  Problem Relation Age of Onset   Healthy Mother    Healthy Father    Social History:  reports that he has quit smoking. He has never used smokeless tobacco. He reports that he does not drink alcohol and does not use drugs.  Allergies:  Allergies  Allergen Reactions   Gramineae Pollens    Lisinopril Cough    Other Reaction(s): Cough   Other     Medications Prior to Admission  Medication Sig Dispense Refill   acetaminophen (TYLENOL) 500 MG tablet Take 500-1,000 mg by mouth every 6 (six) hours as needed for moderate pain.     amLODipine  (NORVASC ) 10 MG tablet Take 1 tablet (10 mg total) by mouth daily. 30 tablet 2   fluticasone  (FLONASE ) 50 MCG/ACT nasal spray Place 2 sprays into both nostrils daily.     loratadine  (CLARITIN ) 10  MG tablet Take 1 tablet (10 mg total) by mouth daily. (Patient taking differently: Take 10 mg by mouth at bedtime.) 30 tablet 2   losartan  (COZAAR ) 100 MG tablet Take 100 mg by mouth daily.     Multiple Vitamins-Minerals (ONE-A-DAY MENS 50+) TABS Take 1 tablet by mouth daily.     oxyCODONE-acetaminophen (PERCOCET) 10-325 MG tablet Take 1 tablet by mouth every 6 (six) hours as needed.     rosuvastatin (CRESTOR) 10 MG tablet Take 10 mg by mouth daily.     diclofenac (VOLTAREN) 75 MG EC tablet 75 mg 2 (two) times daily as needed.     DULoxetine (CYMBALTA) 30 MG capsule Take 30 mg by mouth daily.     gabapentin (NEURONTIN) 400 MG capsule Take 1 capsule by mouth daily.     Misc. Devices (PULSE OXIMETER) MISC 1 each by Does not apply route in the morning and at bedtime. 1 each 0   naloxone (NARCAN) nasal spray 4 mg/0.1 mL CALL 911. SPR CONTENTS OF ONE SPRAYER (0.1ML) INTO ONE NOSTRIL. REPEAT IN 2-3 MIN IF SYMPTOMS OF OPIOID EMERGENCY PERSIST, ALTERNATE NOSTRILS     omega-3 acid ethyl esters (LOVAZA) 1 g capsule Take 2 capsules by mouth 2 (two) times daily.     tadalafil (CIALIS) 20 MG tablet Take 20 mg by mouth daily as needed.  tiZANidine (ZANAFLEX) 4 MG tablet Take 4 mg by mouth 3 (three) times daily as needed for muscle spasms.      Results for orders placed or performed during the hospital encounter of 08/07/23 (from the past 48 hours)  Glucose, capillary     Status: Abnormal   Collection Time: 08/07/23  6:32 AM  Result Value Ref Range   Glucose-Capillary 132 (H) 70 - 99 mg/dL    Comment: Glucose reference range applies only to samples taken after fasting for at least 8 hours.   No results found.  Review of Systems  Constitutional:  Negative for chills and fever.  All other systems reviewed and are negative.   Blood pressure (!) 141/95, pulse 84, temperature 98.1 F (36.7 C), temperature source Oral, resp. rate 18, height 5\' 10"  (1.778 m), weight 109.3 kg, SpO2 99%. Physical  Exam Vitals reviewed.  HENT:     Head: Normocephalic.  Eyes:     Pupils: Pupils are equal, round, and reactive to light.  Cardiovascular:     Rate and Rhythm: Normal rate.  Pulmonary:     Effort: Pulmonary effort is normal.  Abdominal:     General: Abdomen is flat.  Genitourinary:    Comments: No CVAT at present Musculoskeletal:        General: Normal range of motion.     Cervical back: Normal range of motion.  Skin:    General: Skin is warm.  Neurological:     Mental Status: He is alert.  Psychiatric:        Mood and Affect: Mood normal.      Assessment/Plan  Proceed as planned with prostate fiducial marker and SPACE-OAR placement. Risks, benefits, peri-op course discussed.   Melody Spurling., MD 08/07/2023, 6:37 AM

## 2023-08-07 NOTE — Discharge Instructions (Addendum)
 1 - You may have urinary urgency (bladder spasms) and bloody urine on / off for up to a week. This is normal.  2 - Call MD or go to ER for fever >102, severe pain / nausea / vomiting not relieved by medications, or acute change in medical status      No acetaminophen/Tylenol until after 12:22 pm today if needed.     Post Anesthesia Home Care Instructions  Activity: Get plenty of rest for the remainder of the day. A responsible individual must stay with you for 24 hours following the procedure.  For the next 24 hours, DO NOT: -Drive a car -Advertising copywriter -Drink alcoholic beverages -Take any medication unless instructed by your physician -Make any legal decisions or sign important papers.  Meals: Start with liquid foods such as gelatin or soup. Progress to regular foods as tolerated. Avoid greasy, spicy, heavy foods. If nausea and/or vomiting occur, drink only clear liquids until the nausea and/or vomiting subsides. Call your physician if vomiting continues.  Special Instructions/Symptoms: Your throat may feel dry or sore from the anesthesia or the breathing tube placed in your throat during surgery. If this causes discomfort, gargle with warm salt water. The discomfort should disappear within 24 hours.

## 2023-08-10 ENCOUNTER — Encounter (HOSPITAL_COMMUNITY): Payer: Self-pay | Admitting: Urology

## 2023-08-10 ENCOUNTER — Ambulatory Visit (HOSPITAL_COMMUNITY)

## 2023-08-10 ENCOUNTER — Ambulatory Visit
Admission: RE | Admit: 2023-08-10 | Discharge: 2023-08-10 | Disposition: A | Discharge status: Home / Self Care | Source: Ambulatory Visit | Attending: Urology | Admitting: Urology

## 2023-08-10 DIAGNOSIS — C61 Malignant neoplasm of prostate: Secondary | ICD-10-CM | POA: Diagnosis not present

## 2023-08-10 DIAGNOSIS — Z510 Encounter for antineoplastic radiation therapy: Secondary | ICD-10-CM | POA: Insufficient documentation

## 2023-08-10 NOTE — Anesthesia Postprocedure Evaluation (Signed)
 Anesthesia Post Note  Patient: Julian [name]  Procedure(s) Performed: INSERTION, GOLD SEEDS (Prostate) INJECTION, HYDROGEL SPACER (Prostate)     Patient location during evaluation: PACU Anesthesia Type: MAC Level of consciousness: awake and alert Pain management: pain level controlled Vital Signs Assessment: post-procedure vital signs reviewed and stable Respiratory status: spontaneous breathing Cardiovascular status: stable Anesthetic complications: no   No notable events documented.  Last Vitals:  Vitals:   08/07/23 0815 08/07/23 0830  BP: (!) 125/90 (!) 133/94  Pulse: 69 67  Resp: 14 14  Temp:  36.5 C  SpO2: 100% 94%    Last Pain:  Vitals:   08/07/23 0830  TempSrc:   PainSc: 0-No pain                 Gorman Laughter

## 2023-08-11 ENCOUNTER — Encounter (HOSPITAL_COMMUNITY): Payer: Self-pay | Admitting: Urology

## 2023-08-11 NOTE — Progress Notes (Signed)
  Radiation Oncology         [phone]) [phone] ________________________________  Name: Julian [name] MRN: [id number]  Date: 08/10/2023  DOB: Aug 04, 1960  SIMULATION AND TREATMENT PLANNING NOTE    ICD-10-CM   1. Malignant neoplasm of prostate (HCC)  C61       DIAGNOSIS:   63 y.o. gentleman with Stage T1c adenocarcinoma of the prostate with Gleason score of 4+3, and PSA of 9.21.  NARRATIVE:  The patient was brought to the CT Simulation planning suite.  Identity was confirmed.  All relevant records and images related to the planned course of therapy were reviewed.  The patient freely provided informed written consent to proceed with treatment after reviewing the details related to the planned course of therapy. The consent form was witnessed and verified by the simulation staff.  Then, the patient was set-up in a stable reproducible supine position for radiation therapy.  A vacuum lock pillow device was custom fabricated to position his legs in a reproducible immobilized position.  Then, supervised the performance of a urethrogram under sterile conditions to identify the prostatic apex.  CT images were obtained.  Surface markings were placed.  The CT images were loaded into the planning software.  Then the prostate target and avoidance structures including the rectum, bladder, bowel and hips were contoured.  Treatment planning then occurred.  The radiation prescription was entered and confirmed.  A total of one complex treatment devices was fabricated. I have requested : Intensity Modulated Radiotherapy (IMRT) is medically necessary for this case for the following reason:  Rectal sparing.  I have requested daily cone beam CT volumetric image gudiance to track gold fiducial posiitoning along with bladder and rectal filling, this is medically necessary to assure accurate positioning of high dose radiation.  PLAN:  The patient will receive 70 Gy in 28 fractions.  ________________________________  Trilby Fujisawa  Lorri Rota, M.D.

## 2023-08-12 DIAGNOSIS — C61 Malignant neoplasm of prostate: Secondary | ICD-10-CM | POA: Diagnosis not present

## 2023-08-19 ENCOUNTER — Inpatient Hospital Stay: Attending: Radiation Oncology

## 2023-08-19 ENCOUNTER — Other Ambulatory Visit: Payer: Self-pay

## 2023-08-19 ENCOUNTER — Ambulatory Visit
Admission: RE | Admit: 2023-08-19 | Discharge: 2023-08-19 | Disposition: A | Discharge status: Home / Self Care | Source: Ambulatory Visit | Attending: Radiation Oncology

## 2023-08-19 DIAGNOSIS — C61 Malignant neoplasm of prostate: Secondary | ICD-10-CM | POA: Diagnosis not present

## 2023-08-19 LAB — RAD ONC ARIA SESSION SUMMARY
Course Elapsed Days: 0
Plan Fractions Treated to Date: 1
Plan Prescribed Dose Per Fraction: 2.5 Gy
Plan Total Fractions Prescribed: 28
Plan Total Prescribed Dose: 70 Gy
Reference Point Dosage Given to Date: 2.5 Gy
Reference Point Session Dosage Given: 2.5 Gy
Session Number: 1

## 2023-08-19 NOTE — Progress Notes (Signed)
 [facility] CSW Progress Note  Patient stopped by [employer] following provider appointment. Patient requested Customer service manager. CSW provided food bag and contact for Patient Film/video editor for [employer].  Patient has direct contact if needed.  Maudie Sorrow, LCSW Clinical Social Worker [facility]

## 2023-08-20 ENCOUNTER — Ambulatory Visit
Admission: RE | Admit: 2023-08-20 | Discharge: 2023-08-20 | Disposition: A | Discharge status: Home / Self Care | Source: Ambulatory Visit | Attending: Radiation Oncology | Admitting: Radiation Oncology

## 2023-08-20 ENCOUNTER — Other Ambulatory Visit: Payer: Self-pay

## 2023-08-20 DIAGNOSIS — C61 Malignant neoplasm of prostate: Secondary | ICD-10-CM | POA: Diagnosis not present

## 2023-08-20 LAB — RAD ONC ARIA SESSION SUMMARY
Course Elapsed Days: 1
Plan Fractions Treated to Date: 2
Plan Prescribed Dose Per Fraction: 2.5 Gy
Plan Total Fractions Prescribed: 28
Plan Total Prescribed Dose: 70 Gy
Reference Point Dosage Given to Date: 5 Gy
Reference Point Session Dosage Given: 2.5 Gy
Session Number: 2

## 2023-08-20 IMAGING — MR MR LUMBAR SPINE W/O CM
4 of 5 series · 26 of 48 positions shown · non-contrast
Comparison: 02/28/2020

CLINICAL DATA: Low back pain and left leg pain

EXAM:
MRI LUMBAR SPINE WITHOUT CONTRAST
TECHNIQUE: Multiplanar, multisequence MR imaging of the lumbar spine was
performed. No intravenous contrast was administered.

[Series 5: T2 · sagittal · 4.0mm · 0.81mm/px · 6 of 17 slices shown (1 of 2)]
[im 1/17]
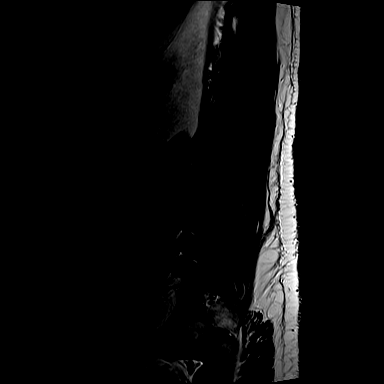
[im 4/17]
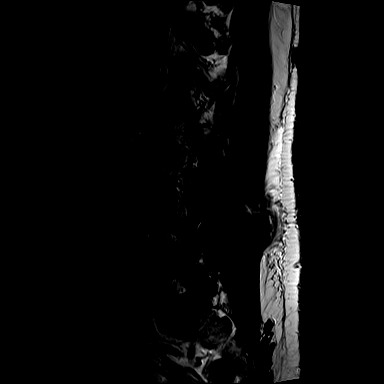
[im 7/17]
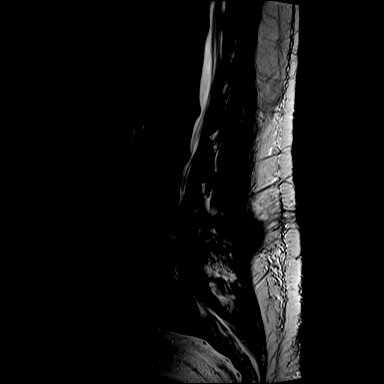
[im 10/17]
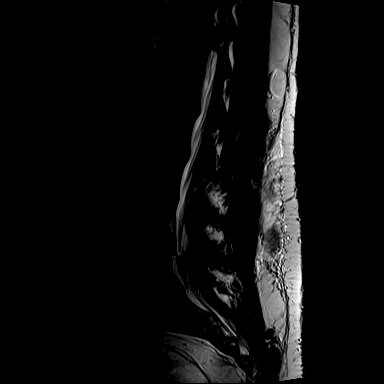
[im 13/17]
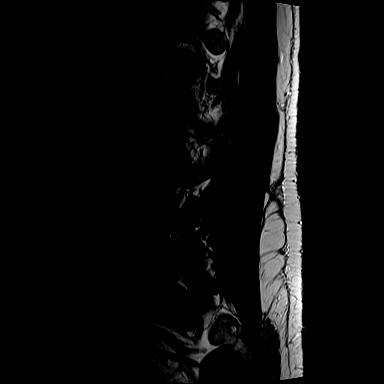
[im 17/17]
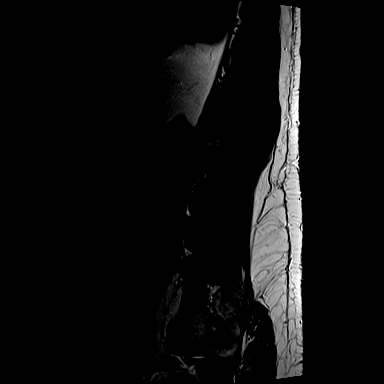

[Series 7: T1 · sagittal · 4.0mm · 0.88mm/px · 7 of 17 slices shown (1 of 2)]
[im 1/17]
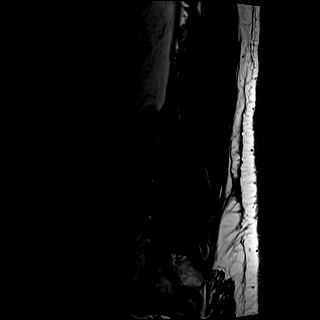
[im 3/17]
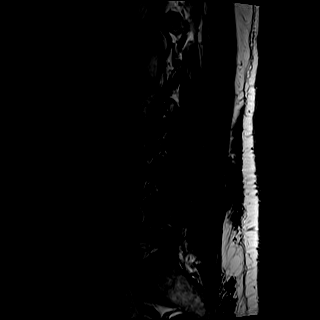
[im 6/17]
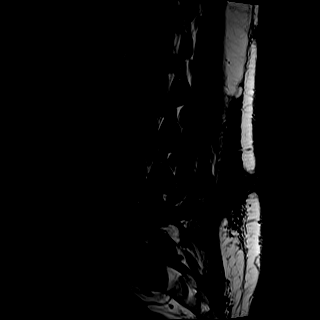
[im 9/17]
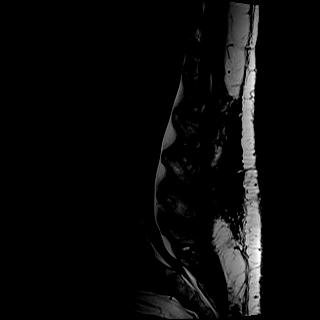
[im 11/17]
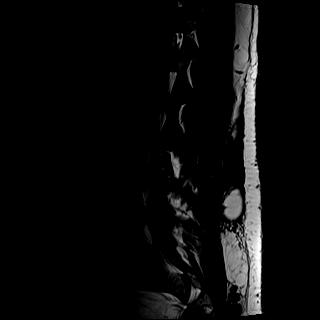
[im 14/17]
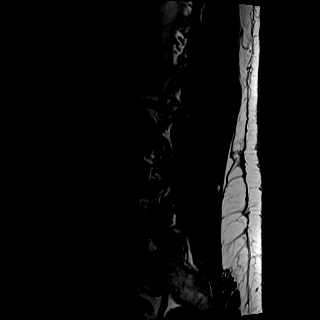
[im 17/17]
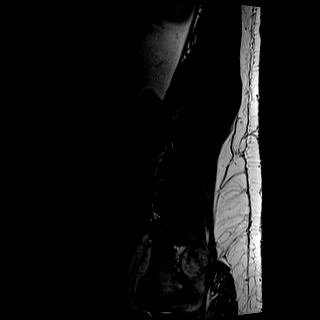

[Series 8: T2 · axial · 4.0mm · 0.57mm/px · z∈[-172,+44]mm · 8 of 36 slices shown (2 of 2)]
[im 1/36]
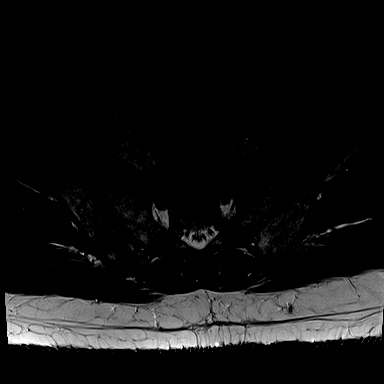
[im 6/36]
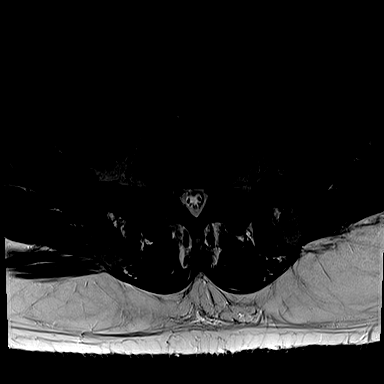
[im 11/36]
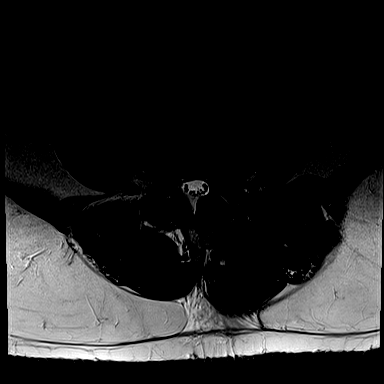
[im 17/36]
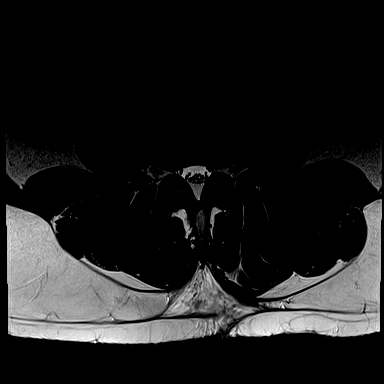
[im 19/36]
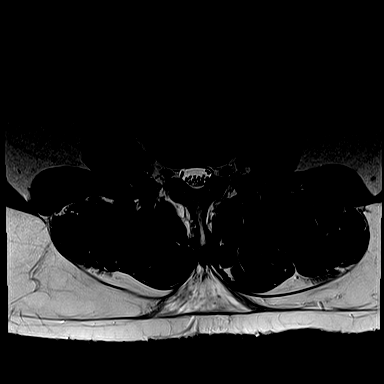
[im 25/36]
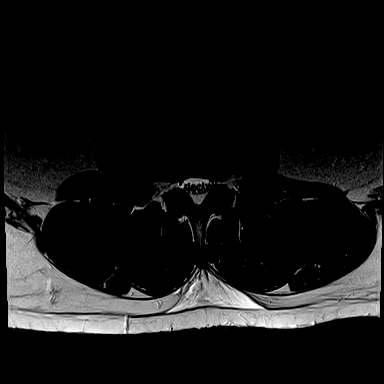
[im 30/36]
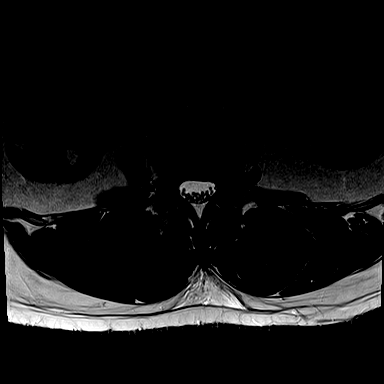
[im 36/36]
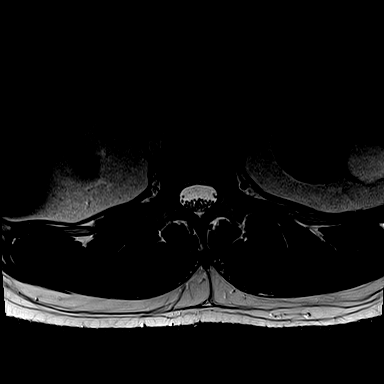

[Series 9: T1 · axial · 4.0mm · 0.34mm/px · z∈[-172,+14]mm · 5 of 36 slices shown (2 of 2)]
[im 1/36]
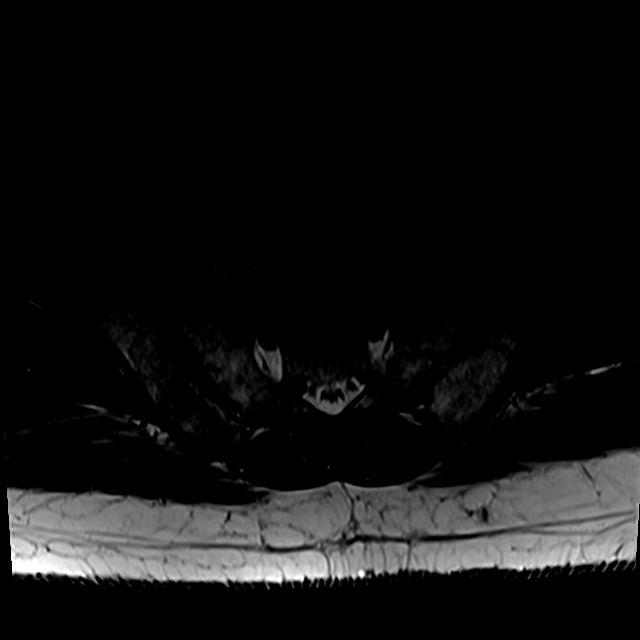
[im 6/36]
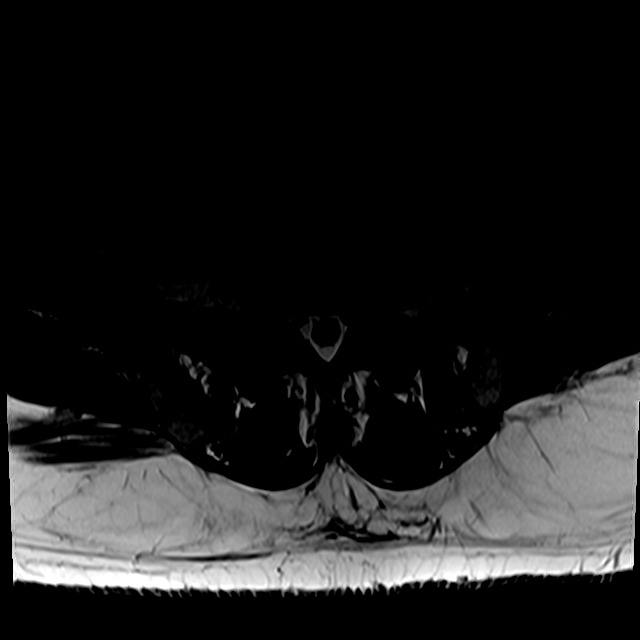
[im 11/36]
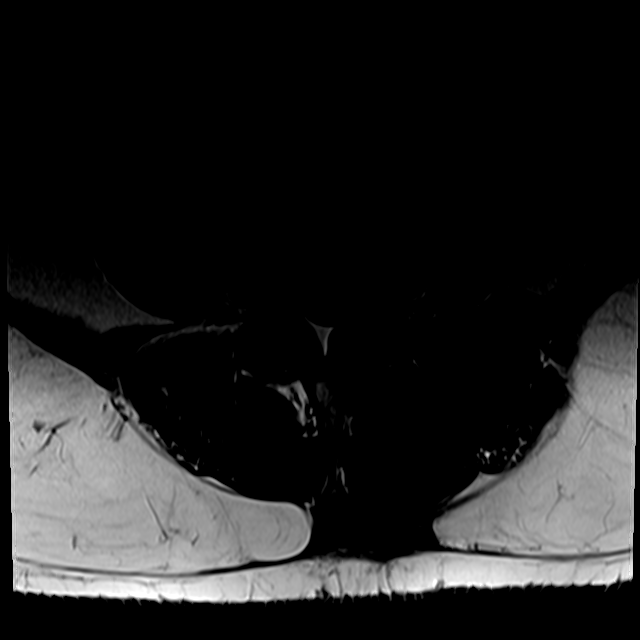
[im 19/36]
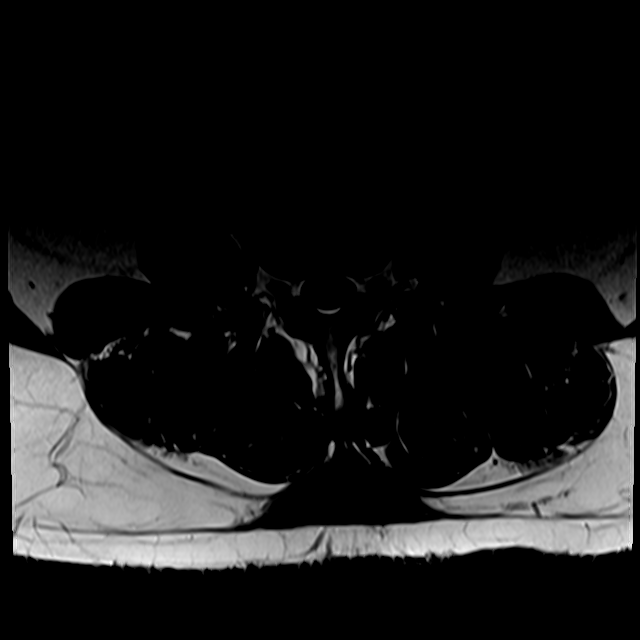
[im 30/36]
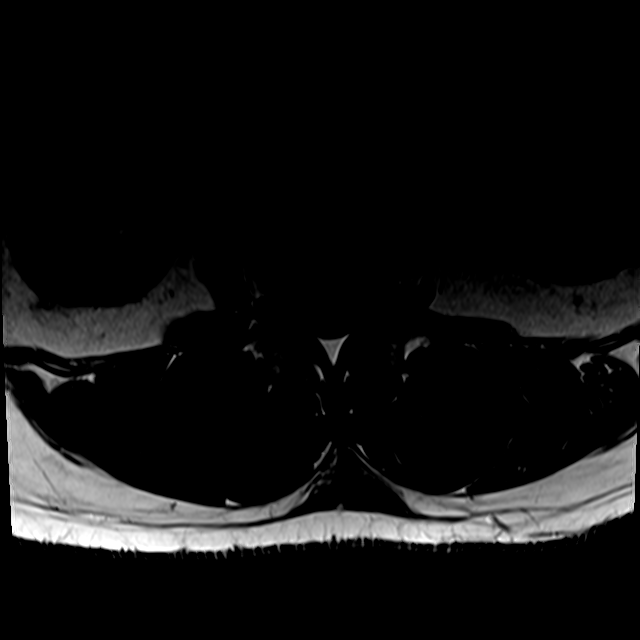

[26 of 48 positions shown; findings below may reference images not displayed]

FINDINGS: Segmentation:  Standard.

Alignment:  Trace retrolisthesis L1 on L2 and L4 on L5, unchanged.

Vertebrae: No acute fracture or suspicious osseous lesion.
Developing Schmorl's node at the posteroinferior aspect of L1.
Redemonstrated, likely lipid poor hemangioma in L3

Conus medullaris and cauda equina: Conus extends to the L1 level.
Conus and cauda equina appear normal.

Paraspinal and other soft tissues: Left-greater-than-right renal
cysts.

Disc levels:

T12-L1: Seen only on the sagittal images. No significant disc bulge,
spinal canal stenosis, or neural foraminal narrowing.

L1-L2: Trace retrolisthesis. Mild disc bulge. Mild facet
arthropathy. No spinal canal stenosis or neural foraminal narrowing.

L2-L3: Mild disc bulge. Mild facet arthropathy. No spinal canal
stenosis or neural foraminal narrowing.

L3-L4: No significant disc bulge. Mild facet arthropathy. No spinal
canal stenosis or neural foraminal narrowing.

L4-L5: Trace retrolisthesis. Mild disc bulge. Mild facet
arthropathy. Status post left hemilaminectomy. Previously noted
spinal canal stenosis at this level is no longer seen. Mild left
neural foraminal narrowing.

L5-S1: Minimal disc bulge. Moderate facet arthropathy. No spinal
canal stenosis or neural foraminal narrowing.
IMPRESSION: 1. Status post L4-L5 hemilaminectomy, with previously noted spinal
canal stenosis at this level no longer seen. Mild left neural
foraminal narrowing.
2. No other significant spinal canal stenosis or neural foraminal
narrowing. Multilevel mild-to-moderate facet arthropathy, which can
also be a cause of back pain.

## 2023-08-21 ENCOUNTER — Ambulatory Visit
Admission: RE | Admit: 2023-08-21 | Discharge: 2023-08-21 | Disposition: A | Discharge status: Home / Self Care | Source: Ambulatory Visit | Attending: Radiation Oncology | Admitting: Radiation Oncology

## 2023-08-21 ENCOUNTER — Other Ambulatory Visit: Payer: Self-pay

## 2023-08-21 DIAGNOSIS — C61 Malignant neoplasm of prostate: Secondary | ICD-10-CM | POA: Diagnosis not present

## 2023-08-21 LAB — RAD ONC ARIA SESSION SUMMARY
Course Elapsed Days: 2
Plan Fractions Treated to Date: 3
Plan Prescribed Dose Per Fraction: 2.5 Gy
Plan Total Fractions Prescribed: 28
Plan Total Prescribed Dose: 70 Gy
Reference Point Dosage Given to Date: 7.5 Gy
Reference Point Session Dosage Given: 2.5 Gy
Session Number: 3

## 2023-08-24 ENCOUNTER — Other Ambulatory Visit: Payer: Self-pay

## 2023-08-24 ENCOUNTER — Ambulatory Visit
Admission: RE | Admit: 2023-08-24 | Discharge: 2023-08-24 | Disposition: A | Discharge status: Home / Self Care | Source: Ambulatory Visit | Attending: Radiation Oncology | Admitting: Radiation Oncology

## 2023-08-24 DIAGNOSIS — Z510 Encounter for antineoplastic radiation therapy: Secondary | ICD-10-CM | POA: Insufficient documentation

## 2023-08-24 DIAGNOSIS — C61 Malignant neoplasm of prostate: Secondary | ICD-10-CM | POA: Diagnosis not present

## 2023-08-24 LAB — RAD ONC ARIA SESSION SUMMARY
Course Elapsed Days: 5
Plan Fractions Treated to Date: 4
Plan Prescribed Dose Per Fraction: 2.5 Gy
Plan Total Fractions Prescribed: 28
Plan Total Prescribed Dose: 70 Gy
Reference Point Dosage Given to Date: 10 Gy
Reference Point Session Dosage Given: 2.5 Gy
Session Number: 4

## 2023-08-25 ENCOUNTER — Other Ambulatory Visit: Payer: Self-pay

## 2023-08-25 ENCOUNTER — Ambulatory Visit
Admission: RE | Admit: 2023-08-25 | Discharge: 2023-08-25 | Disposition: A | Discharge status: Home / Self Care | Source: Ambulatory Visit | Attending: Radiation Oncology | Admitting: Radiation Oncology

## 2023-08-25 ENCOUNTER — Ambulatory Visit

## 2023-08-25 DIAGNOSIS — Z510 Encounter for antineoplastic radiation therapy: Secondary | ICD-10-CM | POA: Diagnosis not present

## 2023-08-25 LAB — RAD ONC ARIA SESSION SUMMARY
Course Elapsed Days: 6
Plan Fractions Treated to Date: 5
Plan Prescribed Dose Per Fraction: 2.5 Gy
Plan Total Fractions Prescribed: 28
Plan Total Prescribed Dose: 70 Gy
Reference Point Dosage Given to Date: 12.5 Gy
Reference Point Session Dosage Given: 2.5 Gy
Session Number: 5

## 2023-08-26 ENCOUNTER — Other Ambulatory Visit: Payer: Self-pay

## 2023-08-26 ENCOUNTER — Ambulatory Visit
Admission: RE | Admit: 2023-08-26 | Discharge: 2023-08-26 | Disposition: A | Discharge status: Home / Self Care | Source: Ambulatory Visit | Attending: Radiation Oncology | Admitting: Radiation Oncology

## 2023-08-26 DIAGNOSIS — Z510 Encounter for antineoplastic radiation therapy: Secondary | ICD-10-CM | POA: Diagnosis not present

## 2023-08-26 LAB — RAD ONC ARIA SESSION SUMMARY
Course Elapsed Days: 7
Plan Fractions Treated to Date: 6
Plan Prescribed Dose Per Fraction: 2.5 Gy
Plan Total Fractions Prescribed: 28
Plan Total Prescribed Dose: 70 Gy
Reference Point Dosage Given to Date: 15 Gy
Reference Point Session Dosage Given: 2.5 Gy
Session Number: 6

## 2023-08-27 ENCOUNTER — Ambulatory Visit
Admission: RE | Admit: 2023-08-27 | Discharge: 2023-08-27 | Disposition: A | Discharge status: Home / Self Care | Source: Ambulatory Visit | Attending: Radiation Oncology | Admitting: Radiation Oncology

## 2023-08-27 ENCOUNTER — Other Ambulatory Visit: Payer: Self-pay

## 2023-08-27 DIAGNOSIS — Z510 Encounter for antineoplastic radiation therapy: Secondary | ICD-10-CM | POA: Diagnosis not present

## 2023-08-27 LAB — RAD ONC ARIA SESSION SUMMARY
Course Elapsed Days: 8
Plan Fractions Treated to Date: 7
Plan Prescribed Dose Per Fraction: 2.5 Gy
Plan Total Fractions Prescribed: 28
Plan Total Prescribed Dose: 70 Gy
Reference Point Dosage Given to Date: 17.5 Gy
Reference Point Session Dosage Given: 2.5 Gy
Session Number: 7

## 2023-08-28 ENCOUNTER — Other Ambulatory Visit: Payer: Self-pay

## 2023-08-28 ENCOUNTER — Ambulatory Visit
Admission: RE | Admit: 2023-08-28 | Discharge: 2023-08-28 | Disposition: A | Discharge status: Home / Self Care | Source: Ambulatory Visit | Attending: Radiation Oncology | Admitting: Radiation Oncology

## 2023-08-28 ENCOUNTER — Ambulatory Visit

## 2023-08-28 DIAGNOSIS — Z510 Encounter for antineoplastic radiation therapy: Secondary | ICD-10-CM | POA: Diagnosis not present

## 2023-08-28 LAB — RAD ONC ARIA SESSION SUMMARY
Course Elapsed Days: 9
Plan Fractions Treated to Date: 8
Plan Prescribed Dose Per Fraction: 2.5 Gy
Plan Total Fractions Prescribed: 28
Plan Total Prescribed Dose: 70 Gy
Reference Point Dosage Given to Date: 20 Gy
Reference Point Session Dosage Given: 2.5 Gy
Session Number: 8

## 2023-08-31 ENCOUNTER — Ambulatory Visit

## 2023-09-01 ENCOUNTER — Ambulatory Visit
Admission: RE | Admit: 2023-09-01 | Discharge: 2023-09-01 | Disposition: A | Discharge status: Home / Self Care | Source: Ambulatory Visit | Attending: Radiation Oncology | Admitting: Radiation Oncology

## 2023-09-01 ENCOUNTER — Other Ambulatory Visit: Payer: Self-pay

## 2023-09-01 DIAGNOSIS — Z510 Encounter for antineoplastic radiation therapy: Secondary | ICD-10-CM | POA: Diagnosis not present

## 2023-09-01 LAB — RAD ONC ARIA SESSION SUMMARY
Course Elapsed Days: 13
Plan Fractions Treated to Date: 9
Plan Prescribed Dose Per Fraction: 2.5 Gy
Plan Total Fractions Prescribed: 28
Plan Total Prescribed Dose: 70 Gy
Reference Point Dosage Given to Date: 22.5 Gy
Reference Point Session Dosage Given: 2.5 Gy
Session Number: 9

## 2023-09-02 ENCOUNTER — Other Ambulatory Visit: Payer: Self-pay

## 2023-09-02 ENCOUNTER — Ambulatory Visit
Admission: RE | Admit: 2023-09-02 | Discharge: 2023-09-02 | Disposition: A | Discharge status: Home / Self Care | Source: Ambulatory Visit | Attending: Radiation Oncology | Admitting: Radiation Oncology

## 2023-09-02 DIAGNOSIS — Z510 Encounter for antineoplastic radiation therapy: Secondary | ICD-10-CM | POA: Diagnosis not present

## 2023-09-02 LAB — RAD ONC ARIA SESSION SUMMARY
Course Elapsed Days: 14
Plan Fractions Treated to Date: 10
Plan Prescribed Dose Per Fraction: 2.5 Gy
Plan Total Fractions Prescribed: 28
Plan Total Prescribed Dose: 70 Gy
Reference Point Dosage Given to Date: 25 Gy
Reference Point Session Dosage Given: 2.5 Gy
Session Number: 10

## 2023-09-03 ENCOUNTER — Other Ambulatory Visit: Payer: Self-pay

## 2023-09-03 ENCOUNTER — Ambulatory Visit
Admission: RE | Admit: 2023-09-03 | Discharge: 2023-09-03 | Disposition: A | Discharge status: Home / Self Care | Source: Ambulatory Visit | Attending: Radiation Oncology | Admitting: Radiation Oncology

## 2023-09-03 DIAGNOSIS — Z510 Encounter for antineoplastic radiation therapy: Secondary | ICD-10-CM | POA: Diagnosis not present

## 2023-09-03 LAB — RAD ONC ARIA SESSION SUMMARY
Course Elapsed Days: 15
Plan Fractions Treated to Date: 11
Plan Prescribed Dose Per Fraction: 2.5 Gy
Plan Total Fractions Prescribed: 28
Plan Total Prescribed Dose: 70 Gy
Reference Point Dosage Given to Date: 27.5 Gy
Reference Point Session Dosage Given: 2.5 Gy
Session Number: 11

## 2023-09-04 ENCOUNTER — Ambulatory Visit
Admission: RE | Admit: 2023-09-04 | Discharge: 2023-09-04 | Disposition: A | Discharge status: Home / Self Care | Source: Ambulatory Visit | Attending: Radiation Oncology | Admitting: Radiation Oncology

## 2023-09-04 ENCOUNTER — Other Ambulatory Visit: Payer: Self-pay

## 2023-09-04 DIAGNOSIS — Z510 Encounter for antineoplastic radiation therapy: Secondary | ICD-10-CM | POA: Diagnosis not present

## 2023-09-04 LAB — RAD ONC ARIA SESSION SUMMARY
Course Elapsed Days: 16
Plan Fractions Treated to Date: 12
Plan Prescribed Dose Per Fraction: 2.5 Gy
Plan Total Fractions Prescribed: 28
Plan Total Prescribed Dose: 70 Gy
Reference Point Dosage Given to Date: 30 Gy
Reference Point Session Dosage Given: 2.5 Gy
Session Number: 12

## 2023-09-07 ENCOUNTER — Ambulatory Visit
Admission: RE | Admit: 2023-09-07 | Discharge: 2023-09-07 | Disposition: A | Discharge status: Home / Self Care | Source: Ambulatory Visit | Attending: Radiation Oncology | Admitting: Radiation Oncology

## 2023-09-07 ENCOUNTER — Other Ambulatory Visit: Payer: Self-pay

## 2023-09-07 DIAGNOSIS — Z510 Encounter for antineoplastic radiation therapy: Secondary | ICD-10-CM | POA: Diagnosis not present

## 2023-09-07 LAB — RAD ONC ARIA SESSION SUMMARY
Course Elapsed Days: 19
Plan Fractions Treated to Date: 13
Plan Prescribed Dose Per Fraction: 2.5 Gy
Plan Total Fractions Prescribed: 28
Plan Total Prescribed Dose: 70 Gy
Reference Point Dosage Given to Date: 32.5 Gy
Reference Point Session Dosage Given: 2.5 Gy
Session Number: 13

## 2023-09-08 ENCOUNTER — Other Ambulatory Visit: Payer: Self-pay

## 2023-09-08 ENCOUNTER — Ambulatory Visit
Admission: RE | Admit: 2023-09-08 | Discharge: 2023-09-08 | Disposition: A | Discharge status: Home / Self Care | Source: Ambulatory Visit | Attending: Radiation Oncology | Admitting: Radiation Oncology

## 2023-09-08 DIAGNOSIS — Z510 Encounter for antineoplastic radiation therapy: Secondary | ICD-10-CM | POA: Diagnosis not present

## 2023-09-08 LAB — RAD ONC ARIA SESSION SUMMARY
Course Elapsed Days: 20
Plan Fractions Treated to Date: 14
Plan Prescribed Dose Per Fraction: 2.5 Gy
Plan Total Fractions Prescribed: 28
Plan Total Prescribed Dose: 70 Gy
Reference Point Dosage Given to Date: 35 Gy
Reference Point Session Dosage Given: 2.5 Gy
Session Number: 14

## 2023-09-09 ENCOUNTER — Other Ambulatory Visit: Payer: Self-pay

## 2023-09-09 ENCOUNTER — Ambulatory Visit
Admission: RE | Admit: 2023-09-09 | Discharge: 2023-09-09 | Disposition: A | Discharge status: Home / Self Care | Source: Ambulatory Visit | Attending: Radiation Oncology | Admitting: Radiation Oncology

## 2023-09-09 DIAGNOSIS — Z510 Encounter for antineoplastic radiation therapy: Secondary | ICD-10-CM | POA: Diagnosis not present

## 2023-09-09 LAB — RAD ONC ARIA SESSION SUMMARY
Course Elapsed Days: 21
Plan Fractions Treated to Date: 15
Plan Prescribed Dose Per Fraction: 2.5 Gy
Plan Total Fractions Prescribed: 28
Plan Total Prescribed Dose: 70 Gy
Reference Point Dosage Given to Date: 37.5 Gy
Reference Point Session Dosage Given: 2.5 Gy
Session Number: 15

## 2023-09-10 ENCOUNTER — Other Ambulatory Visit: Payer: Self-pay

## 2023-09-10 ENCOUNTER — Ambulatory Visit
Admission: RE | Admit: 2023-09-10 | Discharge: 2023-09-10 | Disposition: A | Discharge status: Home / Self Care | Source: Ambulatory Visit | Attending: Radiation Oncology

## 2023-09-10 DIAGNOSIS — Z510 Encounter for antineoplastic radiation therapy: Secondary | ICD-10-CM | POA: Diagnosis not present

## 2023-09-10 LAB — RAD ONC ARIA SESSION SUMMARY
Course Elapsed Days: 22
Plan Fractions Treated to Date: 16
Plan Prescribed Dose Per Fraction: 2.5 Gy
Plan Total Fractions Prescribed: 28
Plan Total Prescribed Dose: 70 Gy
Reference Point Dosage Given to Date: 40 Gy
Reference Point Session Dosage Given: 2.5 Gy
Session Number: 16

## 2023-09-11 ENCOUNTER — Other Ambulatory Visit: Payer: Self-pay

## 2023-09-11 ENCOUNTER — Ambulatory Visit
Admission: RE | Admit: 2023-09-11 | Discharge: 2023-09-11 | Disposition: A | Discharge status: Home / Self Care | Source: Ambulatory Visit | Attending: Radiation Oncology | Admitting: Radiation Oncology

## 2023-09-11 DIAGNOSIS — Z510 Encounter for antineoplastic radiation therapy: Secondary | ICD-10-CM | POA: Diagnosis not present

## 2023-09-11 LAB — RAD ONC ARIA SESSION SUMMARY
Course Elapsed Days: 23
Plan Fractions Treated to Date: 17
Plan Prescribed Dose Per Fraction: 2.5 Gy
Plan Total Fractions Prescribed: 28
Plan Total Prescribed Dose: 70 Gy
Reference Point Dosage Given to Date: 42.5 Gy
Reference Point Session Dosage Given: 2.5 Gy
Session Number: 17

## 2023-09-14 ENCOUNTER — Ambulatory Visit
Admission: RE | Admit: 2023-09-14 | Discharge: 2023-09-14 | Disposition: A | Discharge status: Home / Self Care | Source: Ambulatory Visit | Attending: Radiation Oncology | Admitting: Radiation Oncology

## 2023-09-14 ENCOUNTER — Other Ambulatory Visit: Payer: Self-pay

## 2023-09-14 DIAGNOSIS — Z510 Encounter for antineoplastic radiation therapy: Secondary | ICD-10-CM | POA: Diagnosis not present

## 2023-09-14 LAB — RAD ONC ARIA SESSION SUMMARY
Course Elapsed Days: 26
Plan Fractions Treated to Date: 18
Plan Prescribed Dose Per Fraction: 2.5 Gy
Plan Total Fractions Prescribed: 28
Plan Total Prescribed Dose: 70 Gy
Reference Point Dosage Given to Date: 45 Gy
Reference Point Session Dosage Given: 2.5 Gy
Session Number: 18

## 2023-09-15 ENCOUNTER — Ambulatory Visit
Admission: RE | Admit: 2023-09-15 | Discharge: 2023-09-15 | Disposition: A | Discharge status: Home / Self Care | Source: Ambulatory Visit | Attending: Radiation Oncology | Admitting: Radiation Oncology

## 2023-09-15 ENCOUNTER — Other Ambulatory Visit: Payer: Self-pay

## 2023-09-15 DIAGNOSIS — Z510 Encounter for antineoplastic radiation therapy: Secondary | ICD-10-CM | POA: Diagnosis not present

## 2023-09-15 LAB — RAD ONC ARIA SESSION SUMMARY
Course Elapsed Days: 27
Plan Fractions Treated to Date: 19
Plan Prescribed Dose Per Fraction: 2.5 Gy
Plan Total Fractions Prescribed: 28
Plan Total Prescribed Dose: 70 Gy
Reference Point Dosage Given to Date: 47.5 Gy
Reference Point Session Dosage Given: 2.5 Gy
Session Number: 19

## 2023-09-16 ENCOUNTER — Other Ambulatory Visit: Payer: Self-pay

## 2023-09-16 ENCOUNTER — Ambulatory Visit
Admission: RE | Admit: 2023-09-16 | Discharge: 2023-09-16 | Disposition: A | Discharge status: Home / Self Care | Source: Ambulatory Visit | Attending: Radiation Oncology

## 2023-09-16 DIAGNOSIS — Z510 Encounter for antineoplastic radiation therapy: Secondary | ICD-10-CM | POA: Diagnosis not present

## 2023-09-16 LAB — RAD ONC ARIA SESSION SUMMARY
Course Elapsed Days: 28
Plan Fractions Treated to Date: 20
Plan Prescribed Dose Per Fraction: 2.5 Gy
Plan Total Fractions Prescribed: 28
Plan Total Prescribed Dose: 70 Gy
Reference Point Dosage Given to Date: 50 Gy
Reference Point Session Dosage Given: 2.5 Gy
Session Number: 20

## 2023-09-17 ENCOUNTER — Other Ambulatory Visit: Payer: Self-pay

## 2023-09-17 ENCOUNTER — Ambulatory Visit
Admission: RE | Admit: 2023-09-17 | Discharge: 2023-09-17 | Disposition: A | Discharge status: Home / Self Care | Source: Ambulatory Visit | Attending: Radiation Oncology | Admitting: Radiation Oncology

## 2023-09-17 DIAGNOSIS — Z510 Encounter for antineoplastic radiation therapy: Secondary | ICD-10-CM | POA: Diagnosis not present

## 2023-09-17 LAB — RAD ONC ARIA SESSION SUMMARY
Course Elapsed Days: 29
Plan Fractions Treated to Date: 21
Plan Prescribed Dose Per Fraction: 2.5 Gy
Plan Total Fractions Prescribed: 28
Plan Total Prescribed Dose: 70 Gy
Reference Point Dosage Given to Date: 52.5 Gy
Reference Point Session Dosage Given: 2.5 Gy
Session Number: 21

## 2023-09-18 ENCOUNTER — Ambulatory Visit

## 2023-09-21 ENCOUNTER — Ambulatory Visit

## 2023-09-21 ENCOUNTER — Ambulatory Visit
Admission: RE | Admit: 2023-09-21 | Discharge: 2023-09-21 | Disposition: A | Discharge status: Home / Self Care | Source: Ambulatory Visit | Attending: Radiation Oncology | Admitting: Radiation Oncology

## 2023-09-21 ENCOUNTER — Other Ambulatory Visit: Payer: Self-pay

## 2023-09-21 DIAGNOSIS — Z510 Encounter for antineoplastic radiation therapy: Secondary | ICD-10-CM | POA: Diagnosis not present

## 2023-09-21 LAB — RAD ONC ARIA SESSION SUMMARY
Course Elapsed Days: 33
Plan Fractions Treated to Date: 22
Plan Prescribed Dose Per Fraction: 2.5 Gy
Plan Total Fractions Prescribed: 28
Plan Total Prescribed Dose: 70 Gy
Reference Point Dosage Given to Date: 55 Gy
Reference Point Session Dosage Given: 2.5 Gy
Session Number: 22

## 2023-09-22 ENCOUNTER — Other Ambulatory Visit: Payer: Self-pay

## 2023-09-22 ENCOUNTER — Ambulatory Visit
Admission: RE | Admit: 2023-09-22 | Discharge: 2023-09-22 | Disposition: A | Discharge status: Home / Self Care | Source: Ambulatory Visit | Attending: Radiation Oncology | Admitting: Radiation Oncology

## 2023-09-22 ENCOUNTER — Ambulatory Visit

## 2023-09-22 DIAGNOSIS — C61 Malignant neoplasm of prostate: Secondary | ICD-10-CM | POA: Diagnosis not present

## 2023-09-22 DIAGNOSIS — Z510 Encounter for antineoplastic radiation therapy: Secondary | ICD-10-CM | POA: Diagnosis present

## 2023-09-22 LAB — RAD ONC ARIA SESSION SUMMARY
Course Elapsed Days: 34
Plan Fractions Treated to Date: 23
Plan Prescribed Dose Per Fraction: 2.5 Gy
Plan Total Fractions Prescribed: 28
Plan Total Prescribed Dose: 70 Gy
Reference Point Dosage Given to Date: 57.5 Gy
Reference Point Session Dosage Given: 2.5 Gy
Session Number: 23

## 2023-09-23 ENCOUNTER — Ambulatory Visit
Admission: RE | Admit: 2023-09-23 | Discharge: 2023-09-23 | Disposition: A | Discharge status: Home / Self Care | Source: Ambulatory Visit | Attending: Radiation Oncology

## 2023-09-23 ENCOUNTER — Ambulatory Visit

## 2023-09-23 ENCOUNTER — Other Ambulatory Visit: Payer: Self-pay

## 2023-09-23 DIAGNOSIS — Z510 Encounter for antineoplastic radiation therapy: Secondary | ICD-10-CM | POA: Diagnosis not present

## 2023-09-23 LAB — RAD ONC ARIA SESSION SUMMARY
Course Elapsed Days: 35
Plan Fractions Treated to Date: 24
Plan Prescribed Dose Per Fraction: 2.5 Gy
Plan Total Fractions Prescribed: 28
Plan Total Prescribed Dose: 70 Gy
Reference Point Dosage Given to Date: 60 Gy
Reference Point Session Dosage Given: 2.5 Gy
Session Number: 24

## 2023-09-24 ENCOUNTER — Other Ambulatory Visit: Payer: Self-pay

## 2023-09-24 ENCOUNTER — Ambulatory Visit
Admission: RE | Admit: 2023-09-24 | Discharge: 2023-09-24 | Disposition: A | Discharge status: Home / Self Care | Source: Ambulatory Visit | Attending: Radiation Oncology | Admitting: Radiation Oncology

## 2023-09-24 DIAGNOSIS — Z510 Encounter for antineoplastic radiation therapy: Secondary | ICD-10-CM | POA: Diagnosis not present

## 2023-09-24 LAB — RAD ONC ARIA SESSION SUMMARY
Course Elapsed Days: 36
Plan Fractions Treated to Date: 25
Plan Prescribed Dose Per Fraction: 2.5 Gy
Plan Total Fractions Prescribed: 28
Plan Total Prescribed Dose: 70 Gy
Reference Point Dosage Given to Date: 62.5 Gy
Reference Point Session Dosage Given: 2.5 Gy
Session Number: 25

## 2023-09-28 ENCOUNTER — Ambulatory Visit

## 2023-09-28 ENCOUNTER — Ambulatory Visit
Admission: RE | Admit: 2023-09-28 | Discharge: 2023-09-28 | Disposition: A | Discharge status: Home / Self Care | Source: Ambulatory Visit | Attending: Radiation Oncology | Admitting: Radiation Oncology

## 2023-09-28 ENCOUNTER — Other Ambulatory Visit: Payer: Self-pay

## 2023-09-28 DIAGNOSIS — Z510 Encounter for antineoplastic radiation therapy: Secondary | ICD-10-CM | POA: Diagnosis not present

## 2023-09-28 LAB — RAD ONC ARIA SESSION SUMMARY
Course Elapsed Days: 40
Plan Fractions Treated to Date: 26
Plan Prescribed Dose Per Fraction: 2.5 Gy
Plan Total Fractions Prescribed: 28
Plan Total Prescribed Dose: 70 Gy
Reference Point Dosage Given to Date: 65 Gy
Reference Point Session Dosage Given: 2.5 Gy
Session Number: 26

## 2023-09-29 ENCOUNTER — Ambulatory Visit

## 2023-09-29 ENCOUNTER — Ambulatory Visit
Admission: RE | Admit: 2023-09-29 | Discharge: 2023-09-29 | Disposition: A | Discharge status: Home / Self Care | Source: Ambulatory Visit | Attending: Radiation Oncology

## 2023-09-29 ENCOUNTER — Other Ambulatory Visit: Payer: Self-pay

## 2023-09-29 DIAGNOSIS — Z510 Encounter for antineoplastic radiation therapy: Secondary | ICD-10-CM | POA: Diagnosis not present

## 2023-09-29 LAB — RAD ONC ARIA SESSION SUMMARY
Course Elapsed Days: 41
Plan Fractions Treated to Date: 27
Plan Prescribed Dose Per Fraction: 2.5 Gy
Plan Total Fractions Prescribed: 28
Plan Total Prescribed Dose: 70 Gy
Reference Point Dosage Given to Date: 67.5 Gy
Reference Point Session Dosage Given: 2.5 Gy
Session Number: 27

## 2023-09-30 ENCOUNTER — Ambulatory Visit

## 2023-09-30 ENCOUNTER — Other Ambulatory Visit: Payer: Self-pay

## 2023-09-30 ENCOUNTER — Ambulatory Visit
Admission: RE | Admit: 2023-09-30 | Discharge: 2023-09-30 | Disposition: A | Discharge status: Home / Self Care | Source: Ambulatory Visit | Attending: Radiation Oncology | Admitting: Radiation Oncology

## 2023-09-30 DIAGNOSIS — Z510 Encounter for antineoplastic radiation therapy: Secondary | ICD-10-CM | POA: Diagnosis not present

## 2023-09-30 DIAGNOSIS — C61 Malignant neoplasm of prostate: Secondary | ICD-10-CM

## 2023-09-30 LAB — RAD ONC ARIA SESSION SUMMARY
Course Elapsed Days: 42
Plan Fractions Treated to Date: 28
Plan Prescribed Dose Per Fraction: 2.5 Gy
Plan Total Fractions Prescribed: 28
Plan Total Prescribed Dose: 70 Gy
Reference Point Dosage Given to Date: 70 Gy
Reference Point Session Dosage Given: 2.5 Gy
Session Number: 28

## 2023-10-01 NOTE — Radiation Completion Notes (Addendum)
  Radiation Oncology         [phone] ________________________________  Name: Julian [name] MRN: [id number]  Date: 09/30/2023  DOB: 11-Jan-1961  Referring Physician: VICTOR SHOWALTER, M.D. Date of Service: 2023-10-01 Radiation Oncologist: Adina Barge, M.D. [facility]     RADIATION ONCOLOGY END OF TREATMENT NOTE     Diagnosis:  63 y.o. gentleman with Stage T1c adenocarcinoma of the prostate with Gleason score of 4+3, and PSA of 9.21.   Intent: Curative     ==========DELIVERED PLANS==========  First Treatment Date: 2023-08-19 Last Treatment Date: 2023-09-30   Plan Name: Prostate Site: Prostate Technique: IMRT Mode: Photon Dose Per Fraction: 2.5 Gy Prescribed Dose (Delivered / Prescribed): 70 Gy / 70 Gy Prescribed Fxs (Delivered / Prescribed): 28 / 28; concurrent with ST-ADT     ==========ON TREATMENT VISIT DATES========== 2023-08-21, 2023-09-01, 2023-09-04, 2023-09-11, 2023-09-24, 2023-09-30    See weekly On Treatment Notes in Epic for details in the Media tab (listed as Progress notes on the On Treatment Visit Dates listed above).  He tolerated the treatments relatively well with only mild increased LUTS.   The patient will receive a call in about one month from the radiation oncology department. He will continue follow up with his urologist, Dr. Shane, as well.  ------------------------------------------------   Donnice Barge, MD [facility]  Radiation Oncology Direct Dial: [phone]  Fax: [phone] [email or web address]  Skype  [email or web address]

## 2023-10-06 NOTE — Progress Notes (Signed)
 Patient was a RadOnc Consult on 05/22/23 for his stage T1c adenocarcinoma of the prostate with Gleason Score of 4+3, and PSA of 9.21.  Patient proceed with treatment recommendations of ST-ADT concurrent with 5.5 weeks of external beam radiation and had his final radiation treatment on 7/10.   Patient is scheduled for a post treatment nurse call on 11/03/23 and has his first post treatment PSA on 11/20/23 at [facility].

## 2023-11-03 ENCOUNTER — Ambulatory Visit
Admission: RE | Admit: 2023-11-03 | Discharge: 2023-11-03 | Disposition: A | Discharge status: Home / Self Care | Source: Ambulatory Visit | Attending: Radiation Oncology | Admitting: Radiation Oncology

## 2023-11-03 NOTE — Progress Notes (Signed)
  Radiation Oncology         [phone] ________________________________  Name: Julian [name] MRN: [id number]  Date of Service: 11/03/2023  DOB: Aug 17, 1960  Post Treatment Telephone Note  Diagnosis:  63 y.o. gentleman with Stage T1c adenocarcinoma of the prostate with Gleason score of 4+3, and PSA of 9.21. (as documented in provider EOT note)  Pre Treatment IPSS Score: 10 (as documented in the provider consult note).  The patient was available for call today.   Symptoms of fatigue have improved since completing therapy.  Symptoms of bladder changes have improved since completing therapy. Current symptoms include nocturia x 3, and no medications prescribed for bladder symptoms at this time.  Reports drinking plenty water has helped in alleviating symptoms.  Symptoms of bowel changes have improved since completing therapy. No current symptoms and medications for bowel symptoms include Linzess.   Post Treatment IPSS Score: IPSS Questionnaire (AUA-7): Over the past month.   1)  How often have you had a sensation of not emptying your bladder completely after you finish urinating?  0 - Not at all  2)  How often have you had to urinate again less than two hours after you finished urinating? 0 - Not at all  3)  How often have you found you stopped and started again several times when you urinated?  0 - Not at all  4) How difficult have you found it to postpone urination?  0 - Not at all  5) How often have you had a weak urinary stream?  2 - Less than half the time  6) How often have you had to push or strain to begin urination?  0 - Not at all  7) How many times did you most typically get up to urinate from the time you went to bed until the time you got up in the morning?  3 - 3 times  Total score:  5. Which indicates mild symptoms  0-7 mildly symptomatic   8-19 moderately symptomatic   20-35 severely symptomatic   Patient has a scheduled follow up visit with his urologist, Dr. Steffan Pea, on 12/21/2023 labs and the following week will return for results.  He was counseled that PSA levels will be drawn in the urology office, and was reassured that additional time is expected to improve bowel and bladder symptoms. He was encouraged to call back with concerns or questions regarding radiation.
